# Patient Record
Sex: Female | Born: 2000 | Race: Black or African American | Hispanic: No | Marital: Single | Smoking: Never smoker
Health system: Southern US, Community
[De-identification: ages and names within clinical notes are randomized; demographics above are authoritative.]

## PROBLEM LIST (undated history)

## (undated) ENCOUNTER — Inpatient Hospital Stay (HOSPITAL_COMMUNITY): Payer: Self-pay

## (undated) DIAGNOSIS — J45909 Unspecified asthma, uncomplicated: Secondary | ICD-10-CM

## (undated) DIAGNOSIS — T783XXA Angioneurotic edema, initial encounter: Secondary | ICD-10-CM

## (undated) DIAGNOSIS — Z889 Allergy status to unspecified drugs, medicaments and biological substances status: Secondary | ICD-10-CM

## (undated) DIAGNOSIS — R51 Headache: Secondary | ICD-10-CM

## (undated) DIAGNOSIS — L309 Dermatitis, unspecified: Secondary | ICD-10-CM

## (undated) DIAGNOSIS — T7840XA Allergy, unspecified, initial encounter: Secondary | ICD-10-CM

## (undated) HISTORY — DX: Headache: R51

## (undated) HISTORY — DX: Allergy, unspecified, initial encounter: T78.40XA

## (undated) HISTORY — DX: Angioneurotic edema, initial encounter: T78.3XXA

## (undated) HISTORY — PX: NO PAST SURGERIES: SHX2092

---
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
## 2019-01-12 ENCOUNTER — Telehealth: Payer: Self-pay | Admitting: *Deleted

## 2019-01-12 NOTE — Telephone Encounter (Signed)
Caller is requesting emergency contraception.

## 2019-01-12 NOTE — Telephone Encounter (Signed)
Spoke with patient briefly to see how she would like to obtain medication. She asks for medication to be placed up front for pickup instead of being sent to the pharm. Made provider aware. Bernell List, NP to prepare medication and placed up front for pick up.

## 2019-01-12 NOTE — Telephone Encounter (Signed)
Sending to adolescent clinic

## 2019-01-12 NOTE — Telephone Encounter (Signed)
1 tablet of levonorgestrel 1.5 mg provided to patient. VQM:[id number] exp 05/2021  NDC 50932-671-24

## 2019-01-16 ENCOUNTER — Telehealth: Payer: Self-pay | Admitting: *Deleted

## 2019-01-16 NOTE — Telephone Encounter (Signed)
Caller has a question regarding her menstrual cycle in relation to taking emergency contraception on Monday. Please call her.

## 2019-01-19 NOTE — Telephone Encounter (Signed)
Called to triage. She no longer has any concerns but is open to a urine pregnancy and STI screening. Appointment made for nurse visit. She did state she took EC within 5 days of unprotected sex.

## 2019-01-27 ENCOUNTER — Telehealth: Payer: Self-pay

## 2019-01-27 NOTE — Telephone Encounter (Signed)
Pre-screening for in-office visit  1. Who is bringing the patient to the visit?  PT BRINGING HERSELF  Informed only one adult can bring patient to the visit to limit possible exposure to COVID19. And if they have a face mask to wear it.   2. Has the person bringing the patient or the patient traveled outside of the state in the past 14 days?   3. Has the person bringing the patient or the patient had contact with anyone with suspected or confirmed COVID-19 in the last 14 days?   NO 4. Has the person bringing the patient or the patient had any of these symptoms in the last 14 days? NO Fever (temp 100.4 F or higher) Difficulty breathing Cough  If all answers are negative, advise patient to call our office prior to your appointment if you or the patient develop any of the symptoms listed above. PT ADVISED If any answers are yes, cancel in-office visit and schedule the patient for a same day telehealth visit with a provider to discuss the next steps.

## 2019-01-28 ENCOUNTER — Telehealth: Payer: Self-pay | Admitting: *Deleted

## 2019-01-28 ENCOUNTER — Other Ambulatory Visit: Payer: Self-pay

## 2019-01-28 ENCOUNTER — Encounter: Payer: Self-pay | Admitting: Family

## 2019-01-28 ENCOUNTER — Ambulatory Visit (INDEPENDENT_AMBULATORY_CARE_PROVIDER_SITE_OTHER): Payer: Medicaid Other | Admitting: Family

## 2019-01-28 DIAGNOSIS — J029 Acute pharyngitis, unspecified: Secondary | ICD-10-CM | POA: Diagnosis not present

## 2019-01-28 DIAGNOSIS — Z113 Encounter for screening for infections with a predominantly sexual mode of transmission: Secondary | ICD-10-CM | POA: Diagnosis not present

## 2019-01-28 DIAGNOSIS — Z3202 Encounter for pregnancy test, result negative: Secondary | ICD-10-CM | POA: Diagnosis not present

## 2019-01-28 LAB — POCT URINE PREGNANCY: Preg Test, Ur: NEGATIVE

## 2019-01-28 MED ORDER — POLYETHYLENE GLYCOL 3350 17 GM/SCOOP PO POWD: 17.0000 g | Freq: Every day | ORAL | 0 refills | Status: DC

## 2019-01-28 NOTE — Progress Notes (Signed)
History was provided by the patient.   Wendy Adams is a 18 y.o. female who is here for sore throat.   PCP confirmed? Yes   Wendy Jews, MD (Inactive)  HPI:  18yo female presenting with 2 days of sore throat and nasal congestion. Also a little more tired than usual. No fever, cough, sick contacts, diarrhea, vomiting. She notes she's had intermittent LLQ pain, last stooled last night and it was hard. She feels her allergies have been well controlled during this time. 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   ROS noted in HPI.   Patient Active Problem List   Diagnosis Date Noted  . Asthma, well controlled 04/07/2018  . Seasonal allergies 04/07/2018  . Menstrual cramps 04/07/2018  . Hyperthyroidism 02/26/2018  . Abnormal thyroid blood test 01/25/2018  . Goiter 01/25/2018  . Unintentional weight loss 01/25/2018  . Normal vaginal delivery 03/08/2017  . Perennial and seasonal allergic rhinoconjunctivitis 12/02/2015  . Moderate persistent asthma 12/07/2013  . Eczema 12/07/2013  . 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 albuterol (PROVENTIL HFA;VENTOLIN HFA) 108 (90 Base) MCG/ACT inhaler Inhale 2 puffs into the lungs every 4 (four) hours as needed for wheezing or shortness of breath. 1 Inhaler 2  . EPINEPHrine (EPIPEN 2-PAK) 0.3 mg/0.3 mL IJ SOAJ injection Inject 0.3 mg into the muscle once as needed (for severe allergic reaction).    . fluticasone (FLONASE) 50 MCG/ACT nasal spray Place 2 sprays into both nostrils daily. 16 g 0  . guaiFENesin (MUCINEX) 600 MG 12 hr tablet Take 1 tablet (600 mg total) by mouth 2 (two) times daily. 6 tablet 0  . mometasone (ELOCON) 0.1 % ointment Apply 1 application topically daily as needed (for eczema).     No current  facility-administered medications on file prior to visit.     Allergies  Allergen Reactions  . Food Anaphylaxis, Hives and Other (See Comments)    Pt is allergic to peanut butter and [place].   . Beef-Derived Products Hives  . Chicken Protein Hives  . Eggs Or Egg-Derived Products Hives  . Pork-Derived Metallurgist  . Shellfish Allergy Hives    Physical Exam:    General: appears well nourished, well developed, and in no acute distress  HEENT: normocephalic and atraumatic. Sclera clear. Erythema noted in posterior oropharynx. No exudates or petechiae. Uvula is midline.  Neck: Supple, no lymphadenopathy, no tenderness  Respiratory: Normal WOB, no retractions nasal flaring or grunting. Normal and equal air movement bilaterally, no wheezes or crackles.  CV: Normal rate, regular rhythm. No murmurs rubs clicks or gallops appreciated. Cap refill <3 seconds.  Abdominal: Soft, mild tenderness LLQ, nondistended. No hepatosplenomegaly appreciated.  Extremities: Warm and well perfused  Neuro: Grossly normal, pt is alert, moving all extremities  Skin: No rashes, bruising, jaundice, or mottling noted.   Blood pressure percentiles are not available for patients who are 18 years or older. No LMP recorded.   Assessment/Plan: Wendy Adams is an 18yo female presenting with 2 days of sore throat and rhinorrhea without fever. Differential includes viral URI vs allergies-- overall acute 2 days of symptoms seems more consistent with acute viral infection. No evidence of bacterial infection on exam or history, centor criteria not met. Does not meet criteria  for COVID testing at this time.   1. URI symptoms: viral URI vs allergies - Supportive care reviewed   2. Abdominal pain, likely constipation  - Trial miralax 1-2 caps dialy  - RTC if worsening or not improved    Wendy Silversmith MD PGY-3

## 2019-01-28 NOTE — Telephone Encounter (Signed)
Patient called with c/o cough and chest pains. Advised her to go to [facility] now. Patient voiced understanding.

## 2019-01-29 ENCOUNTER — Other Ambulatory Visit: Payer: Self-pay | Admitting: Family

## 2019-01-29 DIAGNOSIS — B349 Viral infection, unspecified: Secondary | ICD-10-CM

## 2019-01-29 LAB — C. TRACHOMATIS/N. GONORRHOEAE RNA
C. trachomatis RNA, TMA: DETECTED — AB
N. gonorrhoeae RNA, TMA: NOT DETECTED

## 2019-01-29 LAB — WET PREP BY MOLECULAR PROBE
Candida species: NOT DETECTED
MICRO NUMBER:: 470586
SPECIMEN QUALITY:: ADEQUATE
Trichomonas vaginosis: NOT DETECTED

## 2019-01-29 MED ORDER — AZITHROMYCIN 500 MG PO TABS: 1000.0000 mg | ORAL_TABLET | Freq: Once | ORAL | 0 refills | Status: AC

## 2019-01-29 NOTE — Telephone Encounter (Signed)
Called and spoke with patient. She is feeling much better this am and chest pains have subsided. Advised patient to be seen in ED if chest pain sx arise again and importance of prompt assessment. Pt voiced understanding.

## 2019-01-29 NOTE — Progress Notes (Signed)
Ordered My Chart COVID-19 MONITORING COMPANION for patient to self-monitor.

## 2019-01-30 ENCOUNTER — Other Ambulatory Visit: Payer: Self-pay | Admitting: Family

## 2019-01-30 MED ORDER — AZITHROMYCIN 500 MG PO TABS: 1000.0000 mg | ORAL_TABLET | Freq: Once | ORAL | 0 refills | Status: AC

## 2019-02-04 ENCOUNTER — Telehealth: Payer: Self-pay | Admitting: Licensed Clinical Social Worker

## 2019-02-04 NOTE — Telephone Encounter (Signed)
Pre-screening for in-office visit  1. Who is bringing the patient to the visit? Patient bringing herself.  Informed only one adult can bring patient to the visit to limit possible exposure to COVID19. And if they have a face mask to wear it.   2. Has the person bringing the patient or the patient traveled outside of the state in the past 14 days? no   3. Has the person bringing the patient or the patient had contact with anyone with suspected or confirmed COVID-19 in the last 14 days? no   4. Has the person bringing the patient or the patient had any of these symptoms in the last 14 days? no   Fever (temp 100.4 F or higher) Difficulty breathing Cough  If all answers are negative, advise patient to call our office prior to your appointment if you or the patient develop any of the symptoms listed above.  

## 2019-02-05 ENCOUNTER — Other Ambulatory Visit: Payer: Self-pay

## 2019-02-05 ENCOUNTER — Ambulatory Visit (INDEPENDENT_AMBULATORY_CARE_PROVIDER_SITE_OTHER): Payer: Medicaid Other | Admitting: Family

## 2019-02-05 ENCOUNTER — Encounter: Payer: Self-pay | Admitting: Family

## 2019-02-05 VITALS — BP 119/71 | HR 83 | Ht 63.47 in | Wt 102.0 lb

## 2019-02-05 DIAGNOSIS — N946 Dysmenorrhea, unspecified: Secondary | ICD-10-CM | POA: Diagnosis not present

## 2019-02-05 DIAGNOSIS — Z3009 Encounter for other general counseling and advice on contraception: Secondary | ICD-10-CM

## 2019-02-05 DIAGNOSIS — Z789 Other specified health status: Secondary | ICD-10-CM

## 2019-02-05 MED ORDER — ULIPRISTAL ACETATE 30 MG PO TABS
30.0000 mg | ORAL_TABLET | Freq: Once | ORAL | Status: AC
  Administered 2019-02-05: 30 mg via ORAL

## 2019-02-05 NOTE — Progress Notes (Signed)
History was provided by the patient.  Wendy RansomKhianna L Adams is a 18 y.o. female who is here for emergency contraception and information on birth control options.   PCP confirmed? Yes.    Gwenith DailyGrier, Cherece Nicole, MD (Inactive)  HPI:   -unprotected sex yesterday, none before that since her last EC dose -no concerns for infection, no new partner -specifically, no pain with intercourse, no unexplained bleeding, no lesions, no vaginal discharge changes -she has never had birth control before; she says her mom wants her to get information and then she will come back and decide which one  -she has no migraine with aura, no known liver disease or cancers, no hx of DVT/PE  -of note, she has returned to work and has exhibited no concerning symptoms for COVID-19.   Review of Systems  Constitutional: Negative for fever, malaise/fatigue and weight loss.  HENT: Negative for sore throat.   Respiratory: Negative for cough and shortness of breath.   Cardiovascular: Negative for chest pain.  Gastrointestinal: Negative for abdominal pain and nausea.  Genitourinary: Negative for dysuria and frequency.  Skin: Negative for rash.  Neurological: Negative for headaches.     Patient Active Problem List   Diagnosis Date Noted  . Asthma, well controlled 04/07/2018  . Seasonal allergies 04/07/2018  . Menstrual cramps 04/07/2018  . Hyperthyroidism 02/26/2018  . Abnormal thyroid blood test 01/25/2018  . Goiter 01/25/2018  . Unintentional weight loss 01/25/2018  . Normal vaginal delivery 03/08/2017  . Perennial and seasonal allergic rhinoconjunctivitis 12/02/2015  . Moderate persistent asthma 12/07/2013  . Eczema 12/07/2013  . 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 albuterol (PROVENTIL HFA;VENTOLIN HFA) 108 (90 Base) MCG/ACT inhaler Inhale 2 puffs into the lungs every 4 (four) hours as needed for wheezing or  shortness of breath. 1 Inhaler 2  . EPINEPHrine (EPIPEN 2-PAK) 0.3 mg/0.3 mL IJ SOAJ injection Inject 0.3 mg into the muscle once as needed (for severe allergic reaction).    . fluticasone (FLONASE) 50 MCG/ACT nasal spray Place 2 sprays into both nostrils daily. 16 g 0  . guaiFENesin (MUCINEX) 600 MG 12 hr tablet Take 1 tablet (600 mg total) by mouth 2 (two) times daily. 6 tablet 0  . mometasone (ELOCON) 0.1 % ointment Apply 1 application topically daily as needed (for eczema).    . polyethylene glycol powder (GLYCOLAX/MIRALAX) 17 GM/SCOOP powder Take 17 g by mouth daily. Take 1 cap or 17 g by mouth daily. Mix with 8 oz of water. 255 g 0   No current facility-administered medications on file prior to visit.     Allergies  Allergen Reactions  . Food Anaphylaxis, Hives and Other (See Comments)    Pt is allergic to peanut butter and [place].   . Beef-Derived Products Hives  . Chicken Protein Hives  . Eggs Or Egg-Derived Products Hives  . Pork-Derived Conservation officer, natureroducts Hives  . Shellfish Allergy Hives    Physical Exam:    Vitals:   02/05/19 0857  BP: 119/71  Pulse: 83  Weight: 102 lb (46.3 kg)  Height: 5' 3.47" (1.612 m)    Blood pressure percentiles are not available for patients who are 18 years or older. No LMP recorded.  Physical Exam Vitals signs reviewed.  Constitutional:      Appearance: She is not ill-appearing.  Eyes:     Extraocular Movements: Extraocular movements intact.     Pupils:  Pupils are equal, round, and reactive to light.  Neck:     Musculoskeletal: Normal range of motion.  Cardiovascular:     Rate and Rhythm: Normal rate.  Pulmonary:     Effort: Pulmonary effort is normal.  Lymphadenopathy:     Cervical: No cervical adenopathy.  Skin:    General: Skin is warm and dry.     Findings: No rash.  Neurological:     General: No focal deficit present.     Mental Status: She is alert and oriented to person, place, and time.      Assessment/Plan: 1. Menstrual  cramps -Discussed that most methods will improve cramping and bleeding, with the exception of paragard  2. Uses emergency contraception -ulipristal acetate 30 mg tablet given today  -condom use reviewed  3. Birth control counseling -reviewed Tier 1 and Tier 2 birth control options, including IUD, Implant, Depo, Pill, Patch, Ring.  -discussed bleeding profiles for each, including procedures for LARC insertions, removals -discussed confidentiality and consent re: birth control options -gave her bedsider material she can discuss with mom  -advised her that she can return via RN visit for Depo, or My Chart message to schedule an insertion, or request for Rx.

## 2019-02-08 NOTE — Progress Notes (Signed)
Attending Co-Signature.  I am the supervising provider and available for consultation as needed for the nurse practitioner who assisted the resident with the assessment and management plan as documented.     Martha F Perry, MD [facility] Specialist   

## 2019-03-27 ENCOUNTER — Other Ambulatory Visit: Payer: Self-pay | Admitting: Family

## 2019-03-27 ENCOUNTER — Encounter: Payer: Self-pay | Admitting: Family

## 2019-03-27 ENCOUNTER — Ambulatory Visit (INDEPENDENT_AMBULATORY_CARE_PROVIDER_SITE_OTHER): Payer: Medicaid Other | Admitting: Family

## 2019-03-27 DIAGNOSIS — Z789 Other specified health status: Secondary | ICD-10-CM

## 2019-03-27 MED ORDER — ELLA 30 MG PO TABS: 1.0000 | ORAL_TABLET | Freq: Once | ORAL | 0 refills | Status: AC

## 2019-03-27 NOTE — Progress Notes (Signed)
Received phone call from patient; she needs EC and wants to start birth control today. The phone call was terminated prior to receiving any other information from the patient. I sent a prescription of EC to her pharmacy. She called back into office line but I could not hear anyone when I picked up.

## 2019-03-30 MED ORDER — ULIPRISTAL ACETATE 30 MG PO TABS: 1.0000 | ORAL_TABLET | Freq: Once | ORAL | 0 refills | Status: AC

## 2019-04-02 ENCOUNTER — Other Ambulatory Visit: Payer: Self-pay

## 2019-04-02 ENCOUNTER — Ambulatory Visit (INDEPENDENT_AMBULATORY_CARE_PROVIDER_SITE_OTHER): Payer: Medicaid Other | Admitting: Family

## 2019-04-02 VITALS — BP 107/71 | HR 80 | Ht 62.4 in | Wt 98.4 lb

## 2019-04-02 DIAGNOSIS — Z113 Encounter for screening for infections with a predominantly sexual mode of transmission: Secondary | ICD-10-CM

## 2019-04-02 DIAGNOSIS — Z7251 High risk heterosexual behavior: Secondary | ICD-10-CM

## 2019-04-02 MED ORDER — ULIPRISTAL ACETATE 30 MG PO TABS
30.0000 mg | ORAL_TABLET | Freq: Once | ORAL | Status: AC
  Administered 2019-04-02: 30 mg via ORAL

## 2019-04-02 NOTE — Progress Notes (Signed)
Pt came into clinic for plan b. Last period was on 03/18/19. Unprotected sex occurred on 7/10. Consulted Christy Jones,NP who recommended [place]. Patient to come back in 5 days for urine preg and Depo administration.

## 2019-04-03 ENCOUNTER — Encounter: Payer: Self-pay | Admitting: Family

## 2019-04-03 LAB — C. TRACHOMATIS/N. GONORRHOEAE RNA
C. trachomatis RNA, TMA: NOT DETECTED
N. gonorrhoeae RNA, TMA: NOT DETECTED

## 2019-04-07 ENCOUNTER — Ambulatory Visit: Payer: Medicaid Other

## 2019-04-14 ENCOUNTER — Ambulatory Visit: Payer: Medicaid Other

## 2019-04-24 ENCOUNTER — Other Ambulatory Visit: Payer: Self-pay

## 2019-04-24 ENCOUNTER — Ambulatory Visit (INDEPENDENT_AMBULATORY_CARE_PROVIDER_SITE_OTHER): Payer: Medicaid Other

## 2019-04-24 DIAGNOSIS — Z3042 Encounter for surveillance of injectable contraceptive: Secondary | ICD-10-CM

## 2019-04-24 DIAGNOSIS — Z3049 Encounter for surveillance of other contraceptives: Secondary | ICD-10-CM | POA: Diagnosis not present

## 2019-04-24 MED ORDER — MEDROXYPROGESTERONE ACETATE 150 MG/ML IM SUSP
150.0000 mg | Freq: Once | INTRAMUSCULAR | Status: AC
  Administered 2019-04-24: 150 mg via INTRAMUSCULAR

## 2019-04-24 NOTE — Progress Notes (Signed)
Patient arrived in clinic for Depo injection. Patient obtained urine preg which was neg. Patient scheduled for next injection in Oct.

## 2019-05-29 ENCOUNTER — Telehealth: Payer: Self-pay

## 2019-05-29 NOTE — Telephone Encounter (Signed)
Patient returned call at [phone]. She reports heavy bleeding, blood clots, cramps. Also has HA, dizziness.  Changing a large pad at least every 30 minutes sometimes more often. Discussed with Shela Commons NP and advised patient go to Urgent Care for evaluation as soon as possible. Also encouraged fluids to stay hydrated and help with dizziness/HA.

## 2019-05-29 NOTE — Telephone Encounter (Signed)
Patient is on depo and is concerned about spotting. Route to red pod RN.

## 2019-05-29 NOTE — Telephone Encounter (Signed)
Called number on file, no answer, no VM option avail. 

## 2019-06-01 ENCOUNTER — Other Ambulatory Visit (INDEPENDENT_AMBULATORY_CARE_PROVIDER_SITE_OTHER): Payer: Medicaid Other

## 2019-06-01 ENCOUNTER — Other Ambulatory Visit: Payer: Self-pay

## 2019-06-01 VITALS — BP 110/71 | HR 105

## 2019-06-01 DIAGNOSIS — Z3202 Encounter for pregnancy test, result negative: Secondary | ICD-10-CM | POA: Diagnosis not present

## 2019-06-01 DIAGNOSIS — Z113 Encounter for screening for infections with a predominantly sexual mode of transmission: Secondary | ICD-10-CM | POA: Diagnosis not present

## 2019-06-01 LAB — POCT URINE PREGNANCY: Preg Test, Ur: NEGATIVE

## 2019-06-01 NOTE — Telephone Encounter (Signed)
Spoke with patient who states bleeding has decreased since Friday. Suggested patient come into the clinic to r/o infection and have serum hcg. Made same day lab visit. She had provider visit for 9/16 to discuss bleeding and headaches with provider.

## 2019-06-02 ENCOUNTER — Other Ambulatory Visit: Payer: Self-pay | Admitting: Family

## 2019-06-02 LAB — HCG, QUANTITATIVE, PREGNANCY: HCG, Total, QN: <3 m[IU]/mL

## 2019-06-02 LAB — WET PREP BY MOLECULAR PROBE
Candida species: NOT DETECTED
Gardnerella vaginalis: NOT DETECTED
MICRO NUMBER:: 877695
SPECIMEN QUALITY:: ADEQUATE
Trichomonas vaginosis: DETECTED — AB

## 2019-06-02 LAB — C. TRACHOMATIS/N. GONORRHOEAE RNA
C. trachomatis RNA, TMA: DETECTED — AB
N. gonorrhoeae RNA, TMA: NOT DETECTED

## 2019-06-02 MED ORDER — METRONIDAZOLE 250 MG PO TABS: 2000.0000 mg | ORAL_TABLET | Freq: Once | ORAL | Status: DC

## 2019-06-02 NOTE — Patient Instructions (Addendum)
Chlamydia, Female  Chlamydia is an STD (sexually transmitted disease). It is a bacterial infection that spreads (is contagious) through sexual contact. Chlamydia can occur in different areas of the body, including:  The tube that moves urine from the bladder out of the body (urethra).  The lower part of the uterus (cervix).  The throat.  The rectum. This condition is not difficult to treat. However, if left untreated, chlamydia can lead to more serious health problems, including pelvic inflammatory disorder (PID). PID can increase your risk of not being able to have children (sterility). Also, if chlamydia is left untreated and you are pregnant or become pregnant, there is a chance that your baby can become infected during delivery. This may cause serious health problems for the baby. What are the causes? Chlamydia is caused by the bacteria Chlamydia trachomatis. It is passed from an infected partner during sexual activity. Chlamydia can spread through contact with the genitals, mouth, or rectum. What are the signs or symptoms? In some cases, there may not be any symptoms for this condition (asymptomatic), especially early in the infection. If symptoms develop, they may include:  Burning with urination.  Frequent urination.  Vaginal discharge.  Redness, soreness, and swelling (inflammation) of the rectum.  Bleeding or discharge from the rectum.  Abdominal pain.  Pain during sexual intercourse.  Bleeding between menstrual periods.  Itching, burning, or redness in the eyes, or discharge from the eyes. How is this diagnosed? This condition may be diagnosed with:  Urine tests.  Swab tests. Depending on your symptoms, your health care provider may use a cotton swab to collect discharge from your vagina or rectum to test for the bacteria.  A pelvic exam. How is this treated? This condition is treated with antibiotic medicines. If you are pregnant, certain types of antibiotics  will need to be avoided. Follow these instructions at home: Medicines  Take over-the-counter and prescription medicines only as told by your health care provider.  Take your antibiotic medicine as told by your health care provider. Do not stop taking the antibiotic even if you start to feel better. Sexual activity  Tell sexual partners about your infection. This includes any oral, anal, or vaginal sex partners you have had within 60 days of when your symptoms started. Sexual partners should also be treated, even if they have no signs of the disease.  Do not have sex until you and your sexual partners have completed treatment and your health care provider says it is okay. If your health care provider prescribed you a single dose treatment, wait 7 days after taking the treatment before having sex. General instructions  It is your responsibility to get your test results. Ask your health care provider, or the department performing the test, when your results will be ready.  Get plenty of rest.  Eat a healthy, well-balanced diet.  Drink enough fluids to keep your urine clear or pale yellow.  Keep all follow-up visits as told by your health care provider. This is important. You may need to be tested for infection again 3 months after treatment. How is this prevented? The only sure way to prevent chlamydia is to avoid having sex. However, you can lower your risk by:  Using latex condoms correctly every time you have sex.  Not having multiple sexual partners.  Asking if your sexual partner has been tested for STIs and had negative results. Contact a health care provider if:  You develop new symptoms or your symptoms do not  get better after completing treatment.  You have a fever or chills.  You have pain during sexual intercourse. Get help right away if:  Your pain gets worse and does not get better with medicine.  You develop flu-like symptoms, such as night sweats, sore throat, or  muscle aches.  You experience nausea or vomiting.  You have difficulty swallowing.  You have bleeding between periods or after sex.  You have irregular menstrual periods.  You have abdominal or lower back pain that does not get better with medicine.  You feel weak or dizzy, or you faint.  You are pregnant and you develop symptoms of chlamydia. Summary  Chlamydia is an STD (sexually transmitted disease). It is a bacterial infection that spreads (is contagious) through sexual contact.  This condition is not difficult to treat, however. If left untreated, chlamydia can lead to more serious health problems, including pelvic inflammatory disease (PID).  In some cases, there may not be any symptoms for this condition (asymptomatic).  This condition is treated with antibiotic medicines.  Using latex condoms correctly every time you have sex can help prevent chlamydia. This information is not intended to replace advice given to you by your health care provider. Make sure you discuss any questions you have with your health care provider. Document Released: 06/13/2005 Document Revised: 02/25/2018 Document Reviewed: 08/20/2016 Elsevier Patient Education  2020 [employer].  Trichomoniasis Trichomoniasis is an STI (sexually transmitted infection) that can affect both women and men. In women, the outer area of the female genitalia (vulva) and the vagina are affected. In men, mainly the penis is affected, but the prostate and other reproductive organs can also be involved.  This condition can be treated with medicine. It often has no symptoms (is asymptomatic), especially in men. If not treated, trichomoniasis can last for months or years. What are the causes? This condition is caused by a parasite called Trichomonas vaginalis. Trichomoniasis most often spreads from person to person (is contagious) through sexual contact. What increases the risk? The following factors may make you more likely  to develop this condition:  Having unprotected sex.  Having sex with a partner who has trichomoniasis.  Having multiple sexual partners.  Having had previous trichomoniasis infections or other STIs. What are the signs or symptoms? In women, symptoms of trichomoniasis include:  Abnormal vaginal discharge that is clear, white, gray, or yellow-green and foamy and has an unusual "fishy" odor.  Itching and irritation of the vagina and vulva.  Burning or pain during urination or sex.  Redness and swelling of the genitals. In men, symptoms of trichomoniasis include:  Penile discharge that may be foamy or contain pus.  Pain in the penis. This may happen only when urinating.  Itching or irritation inside the penis.  Burning after urination or ejaculation. How is this diagnosed? In women, this condition may be found during a routine Pap test or physical exam. It may be found in men during a routine physical exam. Your health care provider may do tests to help diagnose this infection, such as:  Urine tests (men and women).  The following in women: ? Testing the pH of the vagina. ? A vaginal swab test that checks for the Trichomonas vaginalis parasite. ? Testing vaginal secretions. Your health care provider may test you for other STIs, including HIV (human immunodeficiency virus). How is this treated?  This condition is treated with medicine taken by mouth (orally), such as metronidazole or tinidazole, to fight the infection. Your sexual  partner(s) also need to be tested and treated.  If you are a woman and you plan to become pregnant or think you may be pregnant, tell your health care provider right away. Some medicines that are used to treat the infection should not be taken during pregnancy. Your health care provider may recommend over-the-counter medicines or creams to help relieve itching or irritation. You may be tested for infection again 3 months after treatment. Follow these  instructions at home:  Take and use over-the-counter and prescription medicines, including creams, only as told by your health care provider.  Take your antibiotic medicine as told by your health care provider. Do not stop taking the antibiotic even if you start to feel better.  Do not have sex until 7-10 days after you finish your medicine, or until your health care provider approves. Ask your health care provider when you may start to have sex again.  (Women) Do not douche or wear tampons while you have the infection.  Discuss your infection with your sexual partner(s). Make sure that your partner gets tested and treated, if necessary.  Keep all follow-up visits as told by your health care provider. This is important. How is this prevented?    Use condoms every time you have sex. Using condoms correctly and consistently can help protect against STIs.  Avoid having multiple sexual partners.  Talk with your sexual partner about any symptoms that either of you may have, as well as any history of STIs.  Get tested for STIs and STDs (sexually transmitted diseases) before you have sex. Ask your partner to do the same.  Do not have sexual contact if you have symptoms of trichomoniasis or another STI. Contact a health care provider if:  You still have symptoms after you finish your medicine.  You develop pain in your abdomen.  You have pain when you urinate.  You have bleeding after sex.  You develop a rash.  You feel nauseous or you vomit.  You plan to become pregnant or think you may be pregnant. Summary  Trichomoniasis is an STI (sexually transmitted infection) that can affect both women and men.  This condition often has no symptoms (is asymptomatic), especially in men.  Without treatment, this condition can last for months or years.  You should not have sex until 7-10 days after you finish your medicine, or until your health care provider approves. Ask your health  care provider when you may start to have sex again.  Discuss your infection with your sexual partner(s). Make sure that your partner gets tested and treated, if necessary. This information is not intended to replace advice given to you by your health care provider. Make sure you discuss any questions you have with your health care provider.

## 2019-06-02 NOTE — Progress Notes (Unsigned)
Pt here today for STI treatment. Allergies reviewed. Medication tolerated well. Pt education given- along with importance to abstain from sex for 7 days to ensure infection has cured. Follow up appointment scheduled with provider.  

## 2019-06-03 ENCOUNTER — Ambulatory Visit: Payer: Medicaid Other | Admitting: Family

## 2019-06-04 ENCOUNTER — Other Ambulatory Visit: Payer: Medicaid Other

## 2019-06-09 ENCOUNTER — Other Ambulatory Visit: Payer: Medicaid Other

## 2019-06-10 ENCOUNTER — Other Ambulatory Visit: Payer: Medicaid Other

## 2019-06-12 ENCOUNTER — Other Ambulatory Visit: Payer: Self-pay

## 2019-06-12 ENCOUNTER — Other Ambulatory Visit: Payer: Medicaid Other

## 2019-06-12 ENCOUNTER — Ambulatory Visit (INDEPENDENT_AMBULATORY_CARE_PROVIDER_SITE_OTHER): Payer: Medicaid Other | Admitting: Pediatrics

## 2019-06-12 DIAGNOSIS — A749 Chlamydial infection, unspecified: Secondary | ICD-10-CM

## 2019-06-12 DIAGNOSIS — Z202 Contact with and (suspected) exposure to infections with a predominantly sexual mode of transmission: Secondary | ICD-10-CM

## 2019-06-12 DIAGNOSIS — A599 Trichomoniasis, unspecified: Secondary | ICD-10-CM

## 2019-06-12 MED ORDER — AZITHROMYCIN 500 MG PO TABS
1000.0000 mg | ORAL_TABLET | Freq: Once | ORAL | Status: AC
  Administered 2019-06-12: 1000 mg via ORAL

## 2019-06-12 MED ORDER — METRONIDAZOLE 250 MG PO TABS
2000.0000 mg | ORAL_TABLET | Freq: Once | ORAL | Status: AC
  Administered 2019-06-12: 2000 mg via ORAL

## 2019-06-12 MED ORDER — AZITHROMYCIN 250 MG PO TABS: 1000.0000 mg | ORAL_TABLET | Freq: Every day | ORAL | Status: DC

## 2019-06-12 MED ORDER — CEFTRIAXONE SODIUM 250 MG IJ SOLR
250.0000 mg | Freq: Once | INTRAMUSCULAR | Status: AC
  Administered 2019-06-12: 250 mg via INTRAMUSCULAR

## 2019-06-12 NOTE — Progress Notes (Signed)
History was provided by the patient.  Wendy Adams is a 18 y.o. female who is here for treatment of her trichomonas vaginosis, and c. Trachomatis.      HPI:    - 43 female partner reported.  - Was tested on 9/14 for trichomonas, candida, gardenerella, c. Trach, gonnorhea. She was found to be positive for trichomonas and c.trach.  - She has missed multiple appointments for followup treatment in the past 2 weeks.  - She doesn't know if her partner has had sex with other people. - She doesn't use condoms regularly. - She has no fever, no abdo pain, no vomiting or nausea, no discharge, no urinary symptoms,  - She has returned today to receive treatment.  Patient Active Problem List   Diagnosis Date Noted  . Asthma, well controlled 04/07/2018  . Seasonal allergies 04/07/2018  . Menstrual cramps 04/07/2018  . Hyperthyroidism 02/26/2018  . Abnormal thyroid blood test 01/25/2018  . Goiter 01/25/2018  . Unintentional weight loss 01/25/2018  . Normal vaginal delivery 03/08/2017  . Perennial and seasonal allergic rhinoconjunctivitis 12/02/2015  . Moderate persistent asthma 12/07/2013  . Eczema 12/07/2013  . 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 albuterol (PROVENTIL HFA;VENTOLIN HFA) 108 (90 Base) MCG/ACT inhaler Inhale 2 puffs into the lungs every 4 (four) hours as needed for wheezing or shortness of breath. 1 Inhaler 2  . EPINEPHrine (EPIPEN 2-PAK) 0.3 mg/0.3 mL IJ SOAJ injection Inject 0.3 mg into the muscle once as needed (for severe allergic reaction).    . fluticasone (FLONASE) 50 MCG/ACT nasal spray Place 2 sprays into both nostrils daily. 16 g 0  . guaiFENesin (MUCINEX) 600 MG 12 hr tablet Take 1 tablet (600 mg total) by mouth 2 (two) times daily. 6 tablet 0  . mometasone (ELOCON) 0.1 % ointment Apply 1 application topically daily as needed (for eczema).    . polyethylene glycol  powder (GLYCOLAX/MIRALAX) 17 GM/SCOOP powder Take 17 g by mouth daily. Take 1 cap or 17 g by mouth daily. Mix with 8 oz of water. 255 g 0   Current Facility-Administered Medications on File Prior to Visit  Medication Dose Route Frequency Provider Last Rate Last Dose  . metroNIDAZOLE (FLAGYL) tablet 2,000 mg  2,000 mg Oral Once Parthenia Ames, NP        The following portions of the patient's history were reviewed and updated as appropriate: She  has a past medical history of Allergy, Asthma, Eczema, Headache(784.0), and Multiple allergies. She does not have any pertinent problems on file. She  has a past surgical history that includes No past surgeries. Her family history includes Asthma in her father; Diabetes in her maternal grandfather and maternal grandmother; Eczema in her father; Lupus in her maternal grandmother; Migraines in her mother. She  reports that she has never smoked. She has never used smokeless tobacco. She reports that she does not drink alcohol or use drugs. She is allergic to food; beef-derived products; chicken protein; eggs or egg-derived products; pork-derived products; and shellfish allergy..  Physical Exam:   There were no vitals filed for this visit. Blood pressure percentiles are not available for patients who are 18 years or older. No LMP recorded.    General:   alert, cooperative and no distress  Gait:   normal  Skin:   normal  Oral cavity:   lips, mucosa, and tongue normal; teeth and  gums normal  Eyes:   sclerae white, pupils equal and reactive     Neck:   no adenopathy  Lungs:  clear to auscultation bilaterally  Heart:   regular rate and rhythm, S1, S2 normal, no murmur, click, rub or gallop  Abdomen:  soft, non-tender; bowel sounds normal; no masses,  no organomegaly  GU:  not examined  Extremities:   extremities normal, atraumatic, no cyanosis or edema  Neuro:  normal without focal findings      Assessment/Plan: [name] is a 18 y.o female with  positive testing for trichomonas and C.Trachomatis. She required treatment today with 2g of metronidazole, 1000mg  Azithromycin, and 250mg  IM ceftriaxone. She was given guidance on safe sexual practices and contact tracing. She was provided condoms and written information on use. She was also provided 1000mg  Azithromycin for her partner. She will need to return in 2 weeks for re-testing for C. Trachomatis.  - Immunizations today: none  - Follow-up visit in 2 weeks for re-testing, or sooner as needed.

## 2019-06-12 NOTE — Patient Instructions (Addendum)
Wendy Adams you were treated today for trichomonas and chlamydia. Please come back in 2 weeks for re-testing to make sure the infection has cleared. Ongoing infection can result in significant harm to your reproductive abilities in the future, as well as spread of the infection to others. You have been provided 2 red pills for your partner this will treat the chlamydia infection, however, he must be tested and treated as well.  Thank you for coming today  Welford Roche, MD [facility] PGY1 Peds Teaching Service

## 2019-06-24 ENCOUNTER — Other Ambulatory Visit: Payer: Self-pay

## 2019-06-24 ENCOUNTER — Ambulatory Visit (INDEPENDENT_AMBULATORY_CARE_PROVIDER_SITE_OTHER): Payer: Medicaid Other

## 2019-06-24 DIAGNOSIS — Z113 Encounter for screening for infections with a predominantly sexual mode of transmission: Secondary | ICD-10-CM | POA: Diagnosis not present

## 2019-06-24 NOTE — Progress Notes (Signed)
Here for repeat urine specimen. RTC 07/09/19 for Depo.

## 2019-06-29 LAB — C. TRACHOMATIS/N. GONORRHOEAE RNA

## 2019-06-29 LAB — EXTRA URINE SPECIMEN

## 2019-07-09 ENCOUNTER — Other Ambulatory Visit (HOSPITAL_COMMUNITY)
Admission: RE | Admit: 2019-07-09 | Discharge: 2019-07-09 | Disposition: A | Discharge status: Home / Self Care | Payer: Medicaid Other | Source: Ambulatory Visit | Attending: Pediatrics | Admitting: Pediatrics

## 2019-07-09 ENCOUNTER — Other Ambulatory Visit: Payer: Self-pay

## 2019-07-09 ENCOUNTER — Ambulatory Visit (INDEPENDENT_AMBULATORY_CARE_PROVIDER_SITE_OTHER): Payer: Medicaid Other

## 2019-07-09 ENCOUNTER — Ambulatory Visit: Payer: Self-pay

## 2019-07-09 DIAGNOSIS — Z113 Encounter for screening for infections with a predominantly sexual mode of transmission: Secondary | ICD-10-CM | POA: Insufficient documentation

## 2019-07-09 DIAGNOSIS — Z3049 Encounter for surveillance of other contraceptives: Secondary | ICD-10-CM

## 2019-07-09 DIAGNOSIS — Z3042 Encounter for surveillance of injectable contraceptive: Secondary | ICD-10-CM

## 2019-07-09 MED ORDER — MEDROXYPROGESTERONE ACETATE 150 MG/ML IM SUSP
150.0000 mg | Freq: Once | INTRAMUSCULAR | Status: AC
  Administered 2019-07-09: 150 mg via INTRAMUSCULAR

## 2019-07-09 NOTE — Addendum Note (Signed)
Addended by: Jason Fila on: 07/09/2019 08:53 AM   Modules accepted: Orders

## 2019-07-09 NOTE — Progress Notes (Signed)
Pt presents for depo injection. Pt within depo window, no urine hcg needed. Injection given, tolerated well. F/u depo injection visit scheduled. Also retesting for test of cure. Urine specimen sent to lab. [phone] confidential number.

## 2019-07-10 LAB — URINE CYTOLOGY ANCILLARY ONLY
Chlamydia: NEGATIVE
Comment: NEGATIVE
Comment: NEGATIVE
Comment: NORMAL
Neisseria Gonorrhea: NEGATIVE
Trichomonas: NEGATIVE

## 2019-07-21 ENCOUNTER — Ambulatory Visit: Payer: Medicaid Other | Admitting: Family

## 2019-07-21 ENCOUNTER — Encounter: Payer: Self-pay | Admitting: Family

## 2019-07-21 ENCOUNTER — Other Ambulatory Visit: Payer: Self-pay

## 2019-07-27 ENCOUNTER — Ambulatory Visit: Payer: Medicaid Other | Admitting: Pediatrics

## 2019-08-16 ENCOUNTER — Encounter: Payer: Self-pay | Admitting: Family

## 2019-08-17 ENCOUNTER — Other Ambulatory Visit (INDEPENDENT_AMBULATORY_CARE_PROVIDER_SITE_OTHER): Payer: Medicaid Other

## 2019-08-17 ENCOUNTER — Other Ambulatory Visit: Payer: Self-pay

## 2019-08-17 ENCOUNTER — Encounter: Payer: Self-pay | Admitting: Family

## 2019-08-17 ENCOUNTER — Other Ambulatory Visit (HOSPITAL_COMMUNITY)
Admission: RE | Admit: 2019-08-17 | Discharge: 2019-08-17 | Disposition: A | Discharge status: Home / Self Care | Payer: Medicaid Other | Source: Ambulatory Visit | Attending: Pediatrics | Admitting: Pediatrics

## 2019-08-17 DIAGNOSIS — Z113 Encounter for screening for infections with a predominantly sexual mode of transmission: Secondary | ICD-10-CM | POA: Insufficient documentation

## 2019-08-17 DIAGNOSIS — R300 Dysuria: Secondary | ICD-10-CM | POA: Diagnosis not present

## 2019-08-17 DIAGNOSIS — Z1389 Encounter for screening for other disorder: Secondary | ICD-10-CM

## 2019-08-17 LAB — POCT URINALYSIS DIPSTICK
Bilirubin, UA: NEGATIVE
Blood, UA: POSITIVE
Glucose, UA: NEGATIVE
Ketones, UA: NEGATIVE
Nitrite, UA: NEGATIVE
Protein, UA: POSITIVE — AB
Spec Grav, UA: >=1.03 — AB (ref 1.010–1.025)
Urobilinogen, UA: NEGATIVE E.U./dL — AB
pH, UA: 5 (ref 5.0–8.0)

## 2019-08-17 NOTE — Progress Notes (Signed)
Pt came into clinic for STI screenings due to vaginal irritation. Specimens obtained by Rejeana Brock, CMA and sent to lab per protocol.

## 2019-08-18 ENCOUNTER — Other Ambulatory Visit: Payer: Self-pay | Admitting: Pediatrics

## 2019-08-18 LAB — URINE CYTOLOGY ANCILLARY ONLY
Chlamydia: NEGATIVE
Comment: NEGATIVE
Comment: NEGATIVE
Comment: NORMAL
Neisseria Gonorrhea: NEGATIVE
Trichomonas: NEGATIVE

## 2019-08-18 LAB — WET PREP BY MOLECULAR PROBE
Candida species: NOT DETECTED
Gardnerella vaginalis: NOT DETECTED
MICRO NUMBER:: 1145924
SPECIMEN QUALITY:: ADEQUATE
Trichomonas vaginosis: NOT DETECTED

## 2019-08-18 MED ORDER — MOMETASONE FUROATE 0.1 % EX OINT: 1.0000 "application " | TOPICAL_OINTMENT | Freq: Every day | CUTANEOUS | 3 refills | Status: DC

## 2019-08-19 LAB — URINE CULTURE
MICRO NUMBER:: 1145925
SPECIMEN QUALITY:: ADEQUATE

## 2019-08-21 ENCOUNTER — Telehealth: Payer: Self-pay | Admitting: Pediatrics

## 2019-08-21 ENCOUNTER — Other Ambulatory Visit: Payer: Self-pay | Admitting: Pediatrics

## 2019-08-21 DIAGNOSIS — J4540 Moderate persistent asthma, uncomplicated: Secondary | ICD-10-CM

## 2019-08-21 NOTE — Telephone Encounter (Signed)
[facility] and needs a referral sent to there office due to patient having an appointment on Monday for concerns of her allergies. Would you please place the referral?

## 2019-08-21 NOTE — Telephone Encounter (Signed)
Done

## 2019-08-24 ENCOUNTER — Ambulatory Visit (INDEPENDENT_AMBULATORY_CARE_PROVIDER_SITE_OTHER): Payer: Medicaid Other | Admitting: Allergy

## 2019-08-24 ENCOUNTER — Encounter: Payer: Self-pay | Admitting: Allergy

## 2019-08-24 ENCOUNTER — Other Ambulatory Visit: Payer: Self-pay

## 2019-08-24 VITALS — BP 116/84 | HR 100 | Temp 98.1°F | Resp 16 | Ht 63.1 in | Wt 106.2 lb

## 2019-08-24 DIAGNOSIS — T7800XD Anaphylactic reaction due to unspecified food, subsequent encounter: Secondary | ICD-10-CM

## 2019-08-24 DIAGNOSIS — J4520 Mild intermittent asthma, uncomplicated: Secondary | ICD-10-CM | POA: Diagnosis not present

## 2019-08-24 DIAGNOSIS — J302 Other seasonal allergic rhinitis: Secondary | ICD-10-CM | POA: Diagnosis not present

## 2019-08-24 DIAGNOSIS — L2089 Other atopic dermatitis: Secondary | ICD-10-CM

## 2019-08-24 DIAGNOSIS — J3089 Other allergic rhinitis: Secondary | ICD-10-CM

## 2019-08-24 MED ORDER — TRIAMCINOLONE ACETONIDE 0.5 % EX OINT: TOPICAL_OINTMENT | CUTANEOUS | 2 refills | Status: DC

## 2019-08-24 MED ORDER — ALBUTEROL SULFATE HFA 108 (90 BASE) MCG/ACT IN AERS: 2.0000 | INHALATION_SPRAY | RESPIRATORY_TRACT | 2 refills | Status: DC | PRN

## 2019-08-24 MED ORDER — EPINEPHRINE 0.3 MG/0.3ML IJ SOAJ: 0.3000 mg | INTRAMUSCULAR | 2 refills | Status: DC | PRN

## 2019-08-24 NOTE — Assessment & Plan Note (Addendum)
Currently avoiding shellfish, peanuts, tree nuts, peas and eggs due to reaction after ingestion in the form of whole body pruritus and rash.  No prior epinephrine use.  2017 skin testing was positive to shellfish however negative to peanuts, tree nut, eggs and peas.  Patient did not get recommended allergy blood work.  Continue to avoid shellfish, tree nuts, peanuts, peas and eggs.  Get blood work and will make additional recommendations based on results.  I have prescribed epinephrine injectable and demonstrated proper use. For mild symptoms you can take over the counter antihistamines such as Benadryl and monitor symptoms closely. If symptoms worsen or if you have severe symptoms including breathing issues, throat closure, significant swelling, whole body hives, severe diarrhea and vomiting, lightheadedness then inject epinephrine and seek immediate medical care afterwards.  Food action plan given.

## 2019-08-24 NOTE — Assessment & Plan Note (Signed)
Eczema is usually worse in the winter months and currently having a flare despite using hydrocortisone and mometasone 0.1% ointment.  No specific triggers noted.  Start proper skin care.   May use triamcinolone 0.5% ointment twice a day as daily to affected areas up to 3 weeks in a row; stop when clear and can restart as needed for flares. Do not use on the face, neck, armpits or groin area. Do not use more than 3 weeks in a row.   Start using a daily antihistamine to help with the itching - May use over the counter antihistamines such as Zyrtec (cetirizine), Claritin (loratadine), Allegra (fexofenadine), or Xyzal (levocetirizine) daily.

## 2019-08-24 NOTE — Progress Notes (Signed)
New Patient Note  RE: ANGELIC Adams MRN: [id number] DOB: 04-17-2001 Date of Office Visit: 08/24/2019  Referring provider: No ref. provider found Primary care provider: Georges Mouse, NP  Chief Complaint: Itchy Eyes (Started last month with swelling) and Eczema (Started with cold weather)  History of Present Illness: I had the pleasure of seeing Wendy Adams for initial evaluation at the [facility] on 08/24/2019. She is a 18 y.o. female, who is referred here by Georges Mouse, NP for the evaluation of eczema, asthma and allergy.  Patient was last seen in our office in December 14, 2015 by Dr. Nunzio Cobbs for allergic rhinoconjunctivitis, asthma, food allergy and eczema.  Eczema: Patient had eczema since birth. Mainly occurs on her arms and legs. Describes them as pruritic, hyperpigmented. Usually flares in the winter months. Suspected triggers are unknown. Denies any fevers, chills, changes in medications, foods, personal care products or recent infections. She has tried the following therapies: hydrocortisone and mometasone with no benefit. Systemic steroids no. Uses Vaseline to moisturize.   Food: She reports food allergy to shellfish, peanuts, tree nuts, peas, and ? eggs.  The reaction occurred as a child and was mainly in the form of whole body pruritus and rash. Denies any GI or respiratory symptoms associated with it.  Symptoms usually resolve by the next day. Only had to take benadryl for this in the past.   She does not have access to epinephrine autoinjector as it has expired and not needed to use it.   Past work up includes: 2017 skin testing was positive to shellfish mix; negative peanut, tree nuts, eggs, peas. She did not get bloodwork as recommended.  Dietary History: patient has been eating other foods including milk, sesame, seafood, soy, wheat, meats, fruits and vegetables. No prior sesame ingestion.   Asthma: She reports symptoms of chest tightness,  shortness of breath, coughing, wheezing, nocturnal awakenings for 18 years. Current medications include albuterol prn which help. She reports not using aerochamber with inhalers. She tried the following inhalers: Qvar. Main triggers are exercise. In the last month, frequency of symptoms: 0x/week. Frequency of nocturnal symptoms: 0x/month. Frequency of SABA use: 0x/week. Interference with physical activity: yes. Sleep is undisturbed. In the last 12 months, emergency room visits/urgent care visits/doctor office visits or hospitalizations due to respiratory issues: 0. In the last 12 months, oral steroids courses: 0. Lifetime history of hospitalization for respiratory issues: 0. Prior intubations: 0. She was evaluated by allergist in the past. Smoking exposure: no.   Allergic rhino conjunctivitis: Perennial rhino conjunctivitis and currently taking Flonase prn and OTC antihistamines prn with good benefit.   Assessment and Plan: [name] is a 18 y.o. female with: Other atopic dermatitis Eczema is usually worse in the winter months and currently having a flare despite using hydrocortisone and mometasone 0.1% ointment.  No specific triggers noted.  Start proper skin care.   May use triamcinolone 0.5% ointment twice a day as daily to affected areas up to 3 weeks in a row; stop when clear and can restart as needed for flares. Do not use on the face, neck, armpits or groin area. Do not use more than 3 weeks in a row.   Start using a daily antihistamine to help with the itching - May use over the counter antihistamines such as Zyrtec (cetirizine), Claritin (loratadine), Allegra (fexofenadine), or Xyzal (levocetirizine) daily.  Mild intermittent asthma without complication Asthma since a young child and currently uses albuterol as needed  for exercise-induced symptoms.  Last flare was over a year ago.  Today's spirometry was normal given today's efforts.  May use albuterol rescue inhaler 2 puffs or nebulizer  every 4 to 6 hours as needed for shortness of breath, chest tightness, coughing, and wheezing. May use albuterol rescue inhaler 2 puffs 5 to 15 minutes prior to strenuous physical activities. Monitor frequency of use.   Spacer given and demonstrated proper use with inhaler. Patient understood technique and all questions/concerned were addressed.   Allergy with anaphylaxis due to food, subsequent encounter Currently avoiding shellfish, peanuts, tree nuts, peas and eggs due to reaction after ingestion in the form of whole body pruritus and rash.  No prior epinephrine use.  2017 skin testing was positive to shellfish however negative to peanuts, tree nut, eggs and peas.  Patient did not get recommended allergy blood work.  Continue to avoid shellfish, tree nuts, peanuts, peas and eggs.  Get blood work and will make additional recommendations based on results.  I have prescribed epinephrine injectable and demonstrated proper use. For mild symptoms you can take over the counter antihistamines such as Benadryl and monitor symptoms closely. If symptoms worsen or if you have severe symptoms including breathing issues, throat closure, significant swelling, whole body hives, severe diarrhea and vomiting, lightheadedness then inject epinephrine and seek immediate medical care afterwards.  Food action plan given.   Perennial and seasonal allergic rhinoconjunctivitis Mild perennial rhinoconjunctivitis symptoms and using Flonase and over-the-counter antihistamines as needed with some benefit.  2017 skin testing was positive to dust mites, cockroach, tree, grass, ragweed, weed, mold and cat.  Continue environmental control measures.  May use over the counter antihistamines such as Zyrtec (cetirizine), Claritin (loratadine), Allegra (fexofenadine), or Xyzal (levocetirizine) daily as needed.  May use Flonase 1 to 2 sprays per nostril once a day as needed for nasal symptoms.  Return in about 2 months (around  10/25/2019).  Meds ordered this encounter  Medications  . triamcinolone ointment (KENALOG) 0.5 %    Sig: Apply twice a day to roughened skin area. Do not use on the face, neck, armpits or groin area. Do not use more than 3 weeks in a row.    Dispense:  30 g    Refill:  2  . albuterol (VENTOLIN HFA) 108 (90 Base) MCG/ACT inhaler    Sig: Inhale 2 puffs into the lungs every 4 (four) hours as needed for wheezing or shortness of breath (coughing).    Dispense:  18 g    Refill:  2  . EPINEPHrine 0.3 mg/0.3 mL IJ SOAJ injection    Sig: Inject 0.3 mLs (0.3 mg total) into the muscle as needed for anaphylaxis.    Dispense:  1 each    Refill:  2    Lab Orders     IgE Nut Prof. w/Component Rflx     Allergen Profile, Shellfish     Allergen Egg White     Allergen Pea f12  Other allergy screening: Medication allergy: no Hymenoptera allergy: no Urticaria: no History of recurrent infections suggestive of immunodeficency: no  Diagnostics: Spirometry:  Tracings reviewed. Her effort: It was hard to get consistent efforts and there is a question as to whether this reflects a maximal maneuver. FVC: 3.28L FEV1: 2.81L, 99% predicted FEV1/FVC ratio: 86% Interpretation: No overt abnormalities noted given today's efforts.  Please see scanned spirometry results for details.  Skin Testing: declined as patient had to go back to work.  Past Medical History: Patient Active Problem  List   Diagnosis Date Noted  . Mild intermittent asthma without complication 08/24/2019  . Seasonal and perennial allergic rhinitis 08/24/2019  . Asthma, well controlled 04/07/2018  . Seasonal allergies 04/07/2018  . Menstrual cramps 04/07/2018  . Hyperthyroidism 02/26/2018  . Abnormal thyroid blood test 01/25/2018  . Goiter 01/25/2018  . Unintentional weight loss 01/25/2018  . Normal vaginal delivery 03/08/2017  . Perennial and seasonal allergic rhinoconjunctivitis 12/02/2015  . Moderate persistent asthma 12/07/2013   . Other atopic dermatitis 12/07/2013  . Allergy with anaphylaxis due to food, subsequent encounter 12/07/2013   Past Medical History:  Diagnosis Date  . Allergy   . Angio-edema   . Asthma   . Eczema   . Headache(784.0)   . Multiple allergies    Past Surgical History: Past Surgical History:  Procedure Laterality Date  . NO PAST SURGERIES     Medication List:  Current Outpatient Medications  Medication Sig Dispense Refill  . albuterol (VENTOLIN HFA) 108 (90 Base) MCG/ACT inhaler Inhale 2 puffs into the lungs every 4 (four) hours as needed for wheezing or shortness of breath (coughing). 18 g 2  . EPINEPHrine 0.3 mg/0.3 mL IJ SOAJ injection Inject 0.3 mLs (0.3 mg total) into the muscle as needed for anaphylaxis. 1 each 2  . fluticasone (FLONASE) 50 MCG/ACT nasal spray Place 2 sprays into both nostrils daily. (Patient not taking: Reported on 08/24/2019) 16 g 0  . polyethylene glycol powder (GLYCOLAX/MIRALAX) 17 GM/SCOOP powder Take 17 g by mouth daily. Take 1 cap or 17 g by mouth daily. Mix with 8 oz of water. (Patient not taking: Reported on 08/24/2019) 255 g 0  . triamcinolone ointment (KENALOG) 0.5 % Apply twice a day to roughened skin area. Do not use on the face, neck, armpits or groin area. Do not use more than 3 weeks in a row. 30 g 2   No current facility-administered medications for this visit.    Allergies: Allergies  Allergen Reactions  . Food Anaphylaxis, Hives and Other (See Comments)    Pt is allergic to peanut butter and [place].   . Beef-Derived Products Hives  . Chicken Protein Hives  . Eggs Or Egg-Derived Products Hives  . Pork-Derived Conservation officer, nature  . Shellfish Allergy Hives   Social History: Social History   Socioeconomic History  . Marital status: Single    Spouse name: Not on file  . Number of children: Not on file  . Years of education: Not on file  . Highest education level: Not on file  Occupational History  . Not on file  Social Needs  .  Financial resource strain: Not on file  . Food insecurity    Worry: Not on file    Inability: Not on file  . Transportation needs    Medical: Not on file    Non-medical: Not on file  Tobacco Use  . Smoking status: Never Smoker  . Smokeless tobacco: Never Used  Substance and Sexual Activity  . Alcohol use: No  . Drug use: No  . Sexual activity: Yes    Birth control/protection: Condom  Lifestyle  . Physical activity    Days per week: Not on file    Minutes per session: Not on file  . Stress: Not on file  Relationships  . Social Musician on phone: Not on file    Gets together: Not on file    Attends religious service: Not on file    Active member of club or  organization: Not on file    Attends meetings of clubs or organizations: Not on file    Relationship status: Not on file  Other Topics Concern  . Not on file  Social History Narrative  . Not on file   Lives in a 19 year old home. Smoking: Denies Occupation: Works at [employer] History: Environmental education officer in the house: no Charity fundraiser in the family room: no Carpet in the bedroom: no Heating: gas Cooling: central Pet: no  Family History: Family History  Problem Relation Age of Onset  . Asthma Father   . Eczema Father   . Diabetes Maternal Grandmother   . Lupus Maternal Grandmother   . Diabetes Maternal Grandfather   . Migraines Mother   . Alcohol abuse Neg Hx   . Arthritis Neg Hx   . Birth defects Neg Hx   . Cancer Neg Hx   . COPD Neg Hx   . Depression Neg Hx   . Drug abuse Neg Hx   . Early death Neg Hx   . Hearing loss Neg Hx   . Heart disease Neg Hx   . Hyperlipidemia Neg Hx   . Hypertension Neg Hx   . Kidney disease Neg Hx   . Learning disabilities Neg Hx   . Mental illness Neg Hx   . Mental retardation Neg Hx   . Vision loss Neg Hx   . Allergic rhinitis Neg Hx   . Angioedema Neg Hx   . Atopy Neg Hx   . Immunodeficiency Neg Hx   . Urticaria Neg Hx   . Cystic fibrosis Neg  Hx   . Emphysema Neg Hx    Review of Systems  Constitutional: Negative for appetite change, chills, fever and unexpected weight change.  HENT: Negative for congestion and rhinorrhea.   Eyes: Negative for itching.  Respiratory: Negative for cough, chest tightness, shortness of breath and wheezing.   Cardiovascular: Negative for chest pain.  Gastrointestinal: Negative for abdominal pain.  Genitourinary: Negative for difficulty urinating.  Skin: Positive for rash.  Allergic/Immunologic: Positive for environmental allergies and food allergies.  Neurological: Negative for headaches.   Objective: BP 116/84 (BP Location: Right Arm, Patient Position: Sitting, Cuff Size: Normal)   Pulse 100   Temp 98.1 F (36.7 C) (Temporal)   Resp 16   Ht 5' 3.1" (1.603 m)   Wt 106 lb 3.2 oz (48.2 kg)   SpO2 98%   BMI 18.75 kg/m  Body mass index is 18.75 kg/m. Physical Exam  Constitutional: She is oriented to person, place, and time. She appears well-developed and well-nourished.  HENT:  Head: Normocephalic and atraumatic.  Right Ear: External ear normal.  Left Ear: External ear normal.  Nose: Nose normal.  Mouth/Throat: Oropharynx is clear and moist.  Eyes: Conjunctivae and EOM are normal.  Neck: Neck supple.  Cardiovascular: Normal rate, regular rhythm and normal heart sounds. Exam reveals no gallop and no friction rub.  No murmur heard. Pulmonary/Chest: Effort normal and breath sounds normal. She has no wheezes. She has no rales.  Abdominal: Soft.  Neurological: She is alert and oriented to person, place, and time.  Skin: Skin is warm and dry. Rash noted.  Dry patches of hyperpigmentation on the lower extremities b/l, elbow area and right wrist region.  Psychiatric: She has a normal mood and affect. Her behavior is normal.  Nursing note and vitals reviewed.  The plan was reviewed with the patient/family, and all questions/concerned were addressed.  It was my pleasure to see  Wendy Adams today  and participate in her care. Please feel free to contact me with any questions or concerns.  Sincerely,  Wyline MoodYoon Dathan Attia, DO Allergy & Immunology  [facility] [place] office: [phone] [facility] [place] office: [phone] [place] office: [phone]

## 2019-08-24 NOTE — Assessment & Plan Note (Signed)
Mild perennial rhinoconjunctivitis symptoms and using Flonase and over-the-counter antihistamines as needed with some benefit.  2017 skin testing was positive to dust mites, cockroach, tree, grass, ragweed, weed, mold and cat.  Continue environmental control measures.  May use over the counter antihistamines such as Zyrtec (cetirizine), Claritin (loratadine), Allegra (fexofenadine), or Xyzal (levocetirizine) daily as needed.  May use Flonase 1 to 2 sprays per nostril once a day as needed for nasal symptoms.

## 2019-08-24 NOTE — Patient Instructions (Addendum)
Eczema:  Start proper skin care as below.  May use triamcinolone 0.5% ointment twice a day as daily to affected areas up to 3 weeks in a row; stop when clear and can restart as needed for flares. Do not use on the face, neck, armpits or groin area. Do not use more than 3 weeks in a row.   Start using a daily antihistamine to help with the itching - May use over the counter antihistamines such as Zyrtec (cetirizine), Claritin (loratadine), Allegra (fexofenadine), or Xyzal (levocetirizine) daily.  Asthma:  May use albuterol rescue inhaler 2 puffs or nebulizer every 4 to 6 hours as needed for shortness of breath, chest tightness, coughing, and wheezing. May use albuterol rescue inhaler 2 puffs 5 to 15 minutes prior to strenuous physical activities. Monitor frequency of use.   Food allergy:  Continue to avoid shellfish, tree nuts, peanuts, peas and eggs. Get bloodwork:  We are ordering labs, so please allow 1-2 weeks for the results to come back. With the newly implemented Cures Act, the labs might be visible to you at the same time that they become visible to me. However, I will not address the results until all of the results are back, so please be patient.  In the meantime, continue recommendations in your patient instructions, including avoidance measures (if applicable), until you hear from me.  Once we have the results, will discuss about food re introduction.  I have prescribed epinephrine injectable and demonstrated proper use. For mild symptoms you can take over the counter antihistamines such as Benadryl and monitor symptoms closely. If symptoms worsen or if you have severe symptoms including breathing issues, throat closure, significant swelling, whole body hives, severe diarrhea and vomiting, lightheadedness then inject epinephrine and seek immediate medical care afterwards.  Food action plan given.   Environmental allergies:  Environmental control measures.  2017 Positive skin  testing was positive to dust mites, cockroach, tree, grass, ragweed, weed, mold and cat.  May use over the counter antihistamines such as Zyrtec (cetirizine), Claritin (loratadine), Allegra (fexofenadine), or Xyzal (levocetirizine) daily as needed.  May use Flonase 1 to 2 sprays per nostril once a day as needed for nasal symptoms.  Follow up in 2 months or sooner if needed.  Skin care recommendations  Bath time: . Always use lukewarm water. AVOID very hot or cold water. Marland Kitchen. Keep bathing time to 5-10 minutes. . Do NOT use bubble bath. . Use a mild soap and use just enough to wash the dirty areas. . Do NOT scrub skin vigorously.  . After bathing, pat dry your skin with a towel. Do NOT rub or scrub the skin.  Moisturizers and prescriptions:  . ALWAYS apply moisturizers immediately after bathing (within 3 minutes). This helps to lock-in moisture. . Use the moisturizer several times a day over the whole body. Peri Jefferson. Good summer moisturizers include: Aveeno, CeraVe, Cetaphil. Peri Jefferson. Good winter moisturizers include: Aquaphor, Vaseline, Cerave, Cetaphil, Eucerin, Vanicream. . When using moisturizers along with medications, the moisturizer should be applied about one hour after applying the medication to prevent diluting effect of the medication or moisturize around where you applied the medications. When not using medications, the moisturizer can be continued twice daily as maintenance.  Laundry and clothing: . Avoid laundry products with added color or perfumes. . Use unscented hypo-allergenic laundry products such as Tide free, Cheer free & gentle, and All free and clear.  . If the skin still seems dry or sensitive, you can try double-rinsing the  clothes. . Avoid tight or scratchy clothing such as wool. . Do not use fabric softeners or dyer sheets.  Control of House Dust Mite Allergen . Dust mite allergens are a common trigger of allergy and asthma symptoms. While they can be found throughout the  house, these microscopic creatures thrive in warm, humid environments such as bedding, upholstered furniture and carpeting. . Because so much time is spent in the bedroom, it is essential to reduce mite levels there.  . Encase pillows, mattresses, and box springs in special allergen-proof fabric covers or airtight, zippered plastic covers.  . Bedding should be washed weekly in hot water (130 F) and dried in a hot dryer. Allergen-proof covers are available for comforters and pillows that can't be regularly washed.  Wendee Copp the allergy-proof covers every few months. Minimize clutter in the bedroom. Keep pets out of the bedroom.  Marland Kitchen Keep humidity less than 50% by using a dehumidifier or air conditioning. You can buy a humidity measuring device called a hygrometer to monitor this.  . If possible, replace carpets with hardwood, linoleum, or washable area rugs. If that's not possible, vacuum frequently with a vacuum that has a HEPA filter. . Remove all upholstered furniture and non-washable window drapes from the bedroom. . Remove all non-washable stuffed toys from the bedroom.  Wash stuffed toys weekly. Reducing Pollen Exposure . Pollen seasons: trees (spring), grass (summer) and ragweed/weeds (fall). Marland Kitchen Keep windows closed in your home and car to lower pollen exposure.  Susa Simmonds air conditioning in the bedroom and throughout the house if possible.  . Avoid going out in dry windy days - especially early morning. . Pollen counts are highest between 5 - 10 AM and on dry, hot and windy days.  . Save outside activities for late afternoon or after a heavy rain, when pollen levels are lower.  . Avoid mowing of grass if you have grass pollen allergy. Marland Kitchen Be aware that pollen can also be transported indoors on people and pets.  . Dry your clothes in an automatic dryer rather than hanging them outside where they might collect pollen.  . Rinse hair and eyes before bedtime. Cockroach Allergen Avoidance Cockroaches  are often found in the homes of densely populated urban areas, schools or commercial buildings, but these creatures can lurk almost anywhere. This does not mean that you have a dirty house or living area. . Block all areas where roaches can enter the home. This includes crevices, wall cracks and windows.  . Cockroaches need water to survive, so fix and seal all leaky faucets and pipes. Have an exterminator go through the house when your family and pets are gone to eliminate any remaining roaches. Marland Kitchen Keep food in lidded containers and put pet food dishes away after your pets are done eating. Vacuum and sweep the floor after meals, and take out garbage and recyclables. Use lidded garbage containers in the kitchen. Wash dishes immediately after use and clean under stoves, refrigerators or toasters where crumbs can accumulate. Wipe off the stove and other kitchen surfaces and cupboards regularly. Mold Control . Mold and fungi can grow on a variety of surfaces provided certain temperature and moisture conditions exist.  . Outdoor molds grow on plants, decaying vegetation and soil. The major outdoor mold, Alternaria and Cladosporium, are found in very high numbers during hot and dry conditions. Generally, a late summer - fall peak is seen for common outdoor fungal spores. Rain will temporarily lower outdoor mold spore count, but  counts rise rapidly when the rainy period ends. . The most important indoor molds are Aspergillus and Penicillium. Dark, humid and poorly ventilated basements are ideal sites for mold growth. The next most common sites of mold growth are the bathroom and the kitchen. Outdoor (Seasonal) Mold Control . Use air conditioning and keep windows closed. . Avoid exposure to decaying vegetation. Marland Kitchen Avoid leaf raking. . Avoid grain handling. . Consider wearing a face mask if working in moldy areas.  Indoor (Perennial) Mold Control  . Maintain humidity below 50%. . Get rid of mold growth on hard  surfaces with water, detergent and, if necessary, 5% bleach (do not mix with other cleaners). Then dry the area completely. If mold covers an area more than 10 square feet, consider hiring an indoor environmental professional. . For clothing, washing with soap and water is best. If moldy items cannot be cleaned and dried, throw them away. . Remove sources e.g. contaminated carpets. . Repair and seal leaking roofs or pipes. Using dehumidifiers in damp basements may be helpful, but empty the water and clean units regularly to prevent mildew from forming. All rooms, especially basements, bathrooms and kitchens, require ventilation and cleaning to deter mold and mildew growth. Avoid carpeting on concrete or damp floors, and storing items in damp areas. Pet Allergen Avoidance: . Contrary to popular opinion, there are no "hypoallergenic" breeds of dogs or cats. That is because people are not allergic to an animal's hair, but to an allergen found in the animal's saliva, dander (dead skin flakes) or urine. Pet allergy symptoms typically occur within minutes. For some people, symptoms can build up and become most severe 8 to 12 hours after contact with the animal. People with severe allergies can experience reactions in public places if dander has been transported on the pet owners' clothing. Marland Kitchen Keeping an animal outdoors is only a partial solution, since homes with pets in the yard still have higher concentrations of animal allergens. . Before getting a pet, ask your allergist to determine if you are allergic to animals. If your pet is already considered part of your family, try to minimize contact and keep the pet out of the bedroom and other rooms where you spend a great deal of time. . As with dust mites, vacuum carpets often or replace carpet with a hardwood floor, tile or linoleum. . High-efficiency particulate air (HEPA) cleaners can reduce allergen levels over time. . While dander and saliva are the source  of cat and dog allergens, urine is the source of allergens from rabbits, hamsters, mice and [place] pigs; so ask a non-allergic family member to clean the animal's cage. . If you have a pet allergy, talk to your allergist about the potential for allergy immunotherapy (allergy shots). This strategy can often provide long-term relief.

## 2019-08-24 NOTE — Assessment & Plan Note (Signed)
Asthma since a young child and currently uses albuterol as needed for exercise-induced symptoms.  Last flare was over a year ago.  Today's spirometry was normal given today's efforts.  May use albuterol rescue inhaler 2 puffs or nebulizer every 4 to 6 hours as needed for shortness of breath, chest tightness, coughing, and wheezing. May use albuterol rescue inhaler 2 puffs 5 to 15 minutes prior to strenuous physical activities. Monitor frequency of use.   Spacer given and demonstrated proper use with inhaler. Patient understood technique and all questions/concerned were addressed.

## 2019-08-25 NOTE — Telephone Encounter (Signed)
Referral was sent on 08/24/2019.

## 2019-08-28 ENCOUNTER — Encounter: Payer: Self-pay | Admitting: Family

## 2019-09-24 ENCOUNTER — Ambulatory Visit (INDEPENDENT_AMBULATORY_CARE_PROVIDER_SITE_OTHER): Payer: Medicaid Other

## 2019-09-24 DIAGNOSIS — Z3042 Encounter for surveillance of injectable contraceptive: Secondary | ICD-10-CM

## 2019-09-24 DIAGNOSIS — Z3049 Encounter for surveillance of other contraceptives: Secondary | ICD-10-CM | POA: Diagnosis not present

## 2019-09-24 MED ORDER — MEDROXYPROGESTERONE ACETATE 150 MG/ML IM SUSP
150.0000 mg | Freq: Once | INTRAMUSCULAR | Status: AC
  Administered 2019-09-24: 150 mg via INTRAMUSCULAR

## 2019-09-30 ENCOUNTER — Encounter: Payer: Self-pay | Admitting: Family

## 2019-10-13 ENCOUNTER — Encounter: Payer: Self-pay | Admitting: Family

## 2019-10-14 ENCOUNTER — Telehealth: Payer: Medicaid Other | Admitting: Pediatrics

## 2019-10-14 ENCOUNTER — Other Ambulatory Visit: Payer: Self-pay

## 2019-10-15 NOTE — Progress Notes (Signed)
Opened in error. No show.

## 2019-10-22 ENCOUNTER — Telehealth: Payer: Self-pay

## 2019-10-22 ENCOUNTER — Other Ambulatory Visit: Payer: Self-pay

## 2019-10-22 ENCOUNTER — Other Ambulatory Visit (INDEPENDENT_AMBULATORY_CARE_PROVIDER_SITE_OTHER): Payer: Medicaid Other

## 2019-10-22 ENCOUNTER — Other Ambulatory Visit: Payer: Self-pay | Admitting: Pediatrics

## 2019-10-22 DIAGNOSIS — Z111 Encounter for screening for respiratory tuberculosis: Secondary | ICD-10-CM

## 2019-10-22 DIAGNOSIS — T7800XD Anaphylactic reaction due to unspecified food, subsequent encounter: Secondary | ICD-10-CM | POA: Diagnosis not present

## 2019-10-22 NOTE — Telephone Encounter (Signed)
Patient needs TB drawn for employment. Orders placed.

## 2019-10-22 NOTE — Progress Notes (Signed)
Patient came in for labs [facility]. Labs ordered by Alfonso Ramus. Successful collection.

## 2019-10-26 ENCOUNTER — Encounter: Payer: Self-pay | Admitting: Family

## 2019-10-26 ENCOUNTER — Ambulatory Visit: Payer: Medicaid Other | Admitting: Allergy

## 2019-10-26 LAB — URINE CULTURE
MICRO NUMBER:: 10116687
SPECIMEN QUALITY:: ADEQUATE

## 2019-10-26 LAB — QUANTIFERON-TB GOLD PLUS

## 2019-10-27 LAB — PEANUT COMPONENTS
F352-IgE Ara h 8: 0.11 kU/L — AB
F422-IgE Ara h 1: <0.1 kU/L
F423-IgE Ara h 2: <0.1 kU/L
F424-IgE Ara h 3: <0.1 kU/L
F427-IgE Ara h 9: <0.1 kU/L
F447-IgE Ara h 6: <0.1 kU/L

## 2019-10-27 LAB — PAT ID TIQ DOC

## 2019-10-27 LAB — ALLERGEN PROFILE, SHELLFISH
Clam IgE: 24 kU/L — AB
F023-IgE Crab: 87.3 kU/L — AB
F080-IgE Lobster: 92.6 kU/L — AB
F290-IgE Oyster: 11.6 kU/L — AB
Scallop IgE: 33.5 kU/L — AB
Shrimp IgE: 88.6 kU/L — AB

## 2019-10-27 LAB — PANEL 604726
Cor A 1 IgE: <0.1 kU/L
Cor A 14 IgE: <0.1 kU/L
Cor A 8 IgE: <0.1 kU/L
Cor A 9 IgE: <0.1 kU/L

## 2019-10-27 LAB — IGE NUT PROF. W/COMPONENT RFLX
F017-IgE Hazelnut (Filbert): 0.4 kU/L — AB
F018-IgE Brazil Nut: <0.1 kU/L
F020-IgE Almond: 0.1 kU/L — AB
F202-IgE Cashew Nut: <0.1 kU/L
F203-IgE Pistachio Nut: 0.14 kU/L — AB
F256-IgE Walnut: <0.1 kU/L
Macadamia Nut, IgE: 0.11 kU/L — AB
Peanut, IgE: 0.16 kU/L — AB
Pecan Nut IgE: <0.1 kU/L

## 2019-10-27 LAB — ALLERGEN COMPONENT COMMENTS

## 2019-10-27 LAB — EXTRA SPECIMEN

## 2019-10-27 LAB — QUANTIFERON-TB GOLD PLUS
Mitogen-NIL: >10 IU/mL
NIL: 0.06 IU/mL
QuantiFERON-TB Gold Plus: NEGATIVE
TB1-NIL: 0.05 IU/mL
TB2-NIL: 0 IU/mL

## 2019-10-27 LAB — ALLERGEN PEA F12: Allergen Green Pea IgE: 16.3 kU/L — AB

## 2019-10-27 LAB — PANEL 603851
F232-IgE Ovalbumin: 0.41 kU/L — AB
F233-IgE Ovomucoid: 0.17 kU/L — AB

## 2019-10-27 LAB — IGE EGG WHITE W/COMPONENT RFLX: F001-IgE Egg White: 1.76 kU/L — AB

## 2019-11-05 ENCOUNTER — Ambulatory Visit: Payer: Medicaid Other | Admitting: Family

## 2019-11-11 ENCOUNTER — Other Ambulatory Visit: Payer: Self-pay

## 2019-11-11 ENCOUNTER — Telehealth: Payer: Self-pay | Admitting: Family

## 2019-11-11 MED ORDER — FLUTICASONE PROPIONATE 50 MCG/ACT NA SUSP: 1.0000 | Freq: Every day | NASAL | 0 refills | Status: DC

## 2019-11-11 MED ORDER — TRIAMCINOLONE ACETONIDE 0.5 % EX OINT: TOPICAL_OINTMENT | CUTANEOUS | 0 refills | Status: DC

## 2019-11-11 NOTE — Telephone Encounter (Signed)
Pre-screening for onsite visit  1. Who is bringing the patient to the visit? Herself   Informed only one adult can bring patient to the visit to limit possible exposure to COVID19 and facemasks must be worn while in the building by the patient (ages 2 and older) and adult.  2. Has the person bringing the patient or the patient been around anyone with suspected or confirmed COVID-19 in the last 14 days? No   3. Has the person bringing the patient or the patient been around anyone who has been tested for COVID-19 in the last 14 days? NO  4. Has the person bringing the patient or the patient had any of these symptoms in the last 14 days?No  Fever (temp 100 F or higher) Breathing problems Cough Sore throat Body aches Chills Vomiting Diarrhea   If all answers are negative, advise patient to call our office prior to your appointment if you or the patient develop any of the symptoms listed above.   If any answers are yes, cancel in-office visit and schedule the patient for a same day telehealth visit with a provider to discuss the next steps.

## 2019-11-12 ENCOUNTER — Ambulatory Visit: Payer: Medicaid Other | Admitting: Family

## 2019-11-12 ENCOUNTER — Other Ambulatory Visit: Payer: Self-pay

## 2019-11-18 ENCOUNTER — Encounter: Payer: Self-pay | Admitting: Family

## 2019-11-23 ENCOUNTER — Ambulatory Visit: Payer: Medicaid Other | Admitting: Pediatrics

## 2019-11-23 ENCOUNTER — Telehealth: Payer: Self-pay | Admitting: Family

## 2019-11-23 NOTE — Telephone Encounter (Signed)
Pre-screening for onsite visit  1. Who is bringing the patient to the visit? Self  Informed only one adult can bring patient to the visit to limit possible exposure to COVID19 and facemasks must be worn while in the building by the patient (ages 2 and older) and adult.  2. Has the person bringing the patient or the patient been around anyone with suspected or confirmed COVID-19 in the last 14 days? No  3. Has the person bringing the patient or the patient been around anyone who has been tested for COVID-19 in the last 14 days? No  4. Has the person bringing the patient or the patient had any of these symptoms in the last 14 days? NO  Fever (temp 100 F or higher) Breathing problems Cough Sore throat Body aches Chills Vomiting Diarrhea Loss of taste or smell   If all answers are negative, advise patient to call our office prior to your appointment if you or the patient develop any of the symptoms listed above.   If any answers are yes, cancel in-office visit and schedule the patient for a same day telehealth visit with a provider to discuss the next steps.

## 2019-12-10 ENCOUNTER — Ambulatory Visit (INDEPENDENT_AMBULATORY_CARE_PROVIDER_SITE_OTHER): Payer: Medicaid Other

## 2019-12-10 ENCOUNTER — Other Ambulatory Visit (HOSPITAL_COMMUNITY)
Admission: RE | Admit: 2019-12-10 | Discharge: 2019-12-10 | Disposition: A | Discharge status: Home / Self Care | Payer: Medicaid Other | Source: Ambulatory Visit | Attending: Family | Admitting: Family

## 2019-12-10 ENCOUNTER — Other Ambulatory Visit: Payer: Self-pay

## 2019-12-10 DIAGNOSIS — Z3042 Encounter for surveillance of injectable contraceptive: Secondary | ICD-10-CM

## 2019-12-10 DIAGNOSIS — Z113 Encounter for screening for infections with a predominantly sexual mode of transmission: Secondary | ICD-10-CM

## 2019-12-10 DIAGNOSIS — Z3049 Encounter for surveillance of other contraceptives: Secondary | ICD-10-CM | POA: Diagnosis not present

## 2019-12-10 MED ORDER — MEDROXYPROGESTERONE ACETATE 150 MG/ML IM SUSP
150.0000 mg | Freq: Once | INTRAMUSCULAR | Status: AC
  Administered 2019-12-10: 150 mg via INTRAMUSCULAR

## 2019-12-10 NOTE — Progress Notes (Addendum)
Pt presents for depo injection. Pt within depo window, no urine hcg needed. Injection given, tolerated well. F/u depo injection visit scheduled. Routing GC/Chl and wet prep collected per patient request. Sent to lab.

## 2019-12-10 NOTE — Addendum Note (Signed)
Addended by: Debroah Loop on: 12/10/2019 09:27 AM   Modules accepted: Orders

## 2019-12-11 ENCOUNTER — Other Ambulatory Visit: Payer: Self-pay | Admitting: Family

## 2019-12-11 LAB — URINE CYTOLOGY ANCILLARY ONLY
Chlamydia: POSITIVE — AB
Comment: NEGATIVE
Comment: NORMAL
Neisseria Gonorrhea: NEGATIVE

## 2019-12-11 MED ORDER — AZITHROMYCIN 500 MG PO TABS: 1000.0000 mg | ORAL_TABLET | Freq: Once | ORAL | 0 refills | Status: AC

## 2019-12-12 LAB — WET PREP BY MOLECULAR PROBE
Candida species: NOT DETECTED
Gardnerella vaginalis: NOT DETECTED
MICRO NUMBER:: 10292482
SPECIMEN QUALITY:: ADEQUATE
Trichomonas vaginosis: NOT DETECTED

## 2019-12-14 ENCOUNTER — Other Ambulatory Visit: Payer: Self-pay | Admitting: Pediatrics

## 2019-12-14 MED ORDER — AZITHROMYCIN 500 MG PO TABS: 1000.0000 mg | ORAL_TABLET | Freq: Once | ORAL | 0 refills | Status: AC

## 2019-12-30 ENCOUNTER — Ambulatory Visit: Payer: Medicaid Other | Attending: Internal Medicine

## 2019-12-30 ENCOUNTER — Other Ambulatory Visit: Payer: Medicaid Other

## 2019-12-30 DIAGNOSIS — Z20822 Contact with and (suspected) exposure to covid-19: Secondary | ICD-10-CM | POA: Diagnosis not present

## 2019-12-31 ENCOUNTER — Encounter: Payer: Self-pay | Admitting: Family

## 2019-12-31 LAB — SARS-COV-2, NAA 2 DAY TAT

## 2019-12-31 LAB — NOVEL CORONAVIRUS, NAA: SARS-CoV-2, NAA: NOT DETECTED

## 2020-01-13 ENCOUNTER — Telehealth: Payer: Self-pay | Admitting: Allergy and Immunology

## 2020-01-13 MED ORDER — FLUTICASONE PROPIONATE 50 MCG/ACT NA SUSP: 1.0000 | Freq: Every day | NASAL | 0 refills | Status: DC

## 2020-01-13 MED ORDER — ALBUTEROL SULFATE HFA 108 (90 BASE) MCG/ACT IN AERS: 2.0000 | INHALATION_SPRAY | RESPIRATORY_TRACT | 0 refills | Status: DC | PRN

## 2020-01-13 NOTE — Telephone Encounter (Signed)
Patient called and needs to have her allergy meds called in she said that are pills and dont know what they are.[facility] [phone].

## 2020-01-13 NOTE — Telephone Encounter (Signed)
Called patient and advised that Dr. Selena Batten wanted her to purchase her antihistamines over the counter and there was never a prescription sent in for these medications. Patient verbalized understanding. She requested refills for her Albuterol inhaler and Flonase. Advised that I could send in courtesy refills and that she would need to schedule a follow up visit with Dr. Selena Batten to receive more refills. Patient verbalized understanding and stated that she will need to check her work schedule and call back to make an appointment.

## 2020-01-18 ENCOUNTER — Other Ambulatory Visit: Payer: Self-pay

## 2020-01-18 ENCOUNTER — Telehealth (INDEPENDENT_AMBULATORY_CARE_PROVIDER_SITE_OTHER): Payer: Medicaid Other | Admitting: Pediatrics

## 2020-01-18 DIAGNOSIS — G44219 Episodic tension-type headache, not intractable: Secondary | ICD-10-CM

## 2020-01-18 DIAGNOSIS — G43109 Migraine with aura, not intractable, without status migrainosus: Secondary | ICD-10-CM

## 2020-01-18 NOTE — Patient Instructions (Addendum)
Migrelief is the medication we talked about.  Pediatric Headache Prevention  1. Begin taking the following Over the Counter Medications that are checked:  ? Potassium-Magnesium Aspartate ([employer]NC Brand) 250 mg  OR  Magnesium Oxide 400mg  Take 1 tablet twice daily. Do not combine with calcium, zinc or iron or take with dairy products.  ? Vitamin B2 (riboflavin) 100 mg tablets. Take 1 tablets twice daily with meals. (May turn urine bright yellow)  ? Melatonin __mg. Take 1-2 hours prior to going to sleep. Get [facility] or [employer] brand; synthetic form  ? Migra-eeze  Amount Per Serving = 2 caps = $17.95/month  Riboflavin (vitamin B2) (as riboflavin and riboflavin 5' phosphate) - 400mg   Butterbur (Petasites hybridus) CO2 Extract (root) [std. to 15% petasins (22.5 mg)] - 150mg   Ginger (Zinigiber officinale) Extract (root) [standardized to 5% gingerols (12.5 mg)] - 250g  ? Migravent   ([email or web address].[email or web address]) Ingredients Amount per 3 capsules - $0.65 per pill = $58.50 per month  [place]ter[place] Extract 150 mg (free of harmful levels of PA's)  Proprietary Blend 876 mg (Riboflavin, Magnesium, Coenzyme Q10 )  Can give one 3 times a day for a month then decrease to 1 twice a day   ? Migrelief   ( )  Ingredients Children's version (<12 y/o) - dose is 2 tabs which delivers amounts below. ~$20 per month. Can double   Magnesium (citrate and oxide) 180mg /day  Riboflavin (Vitamin B2) 200mg /day  PuracolT Feverfew (proprietary extract + whole leaf) 50mg /day (Spanish Matricaria [place]).   2. Dietary changes:  a. EAT REGULAR MEALS- avoid missing meals meaning > 5hrs during the day or >13 hrs overnight.  b. LEARN TO RECOGNIZE TRIGGER FOODS such as: caffeine, cheddar cheese, chocolate, red meat, dairy products, vinegar, bacon, hotdogs, pepperoni, bologna, deli meats, smoked fish, sausages. Food with MSG= dry roasted nuts, food, soy sauce.  3. DRINK PLENTY OF WATER:        64 oz of water  is recommended for adults.  Also be sure to avoid caffeine.   4. GET ADEQUATE REST.  School age children need 9-11 hours of sleep and teenagers need 8-10 hours sleep.  Remember, too much sleep (daytime naps), and too little sleep may trigger headaches. Develop and keep bedtime routines.  5.  RECOGNIZE OTHER CAUSES OF HEADACHE: Address Anxiety, depression, allergy and sinus disease and/or vision problems as these contribute to headaches. Other triggers include over-exertion, loud noise, weather changes, strong odors, secondhand smoke, chemical fumes, motion or travel, medication, hormone changes & monthly cycles.  7. PROVIDE CONSISTENT Daily routines:  exercise, meals, sleep  8. KEEP Headache Diary to record frequency, severity, triggers, and monitor treatments.  9. AVOID OVERUSE of over the counter medications (acetaminophen, ibuprofen, naproxen) to treat headache may result in rebound headaches. Don't take more than 3-4 doses of one medication in a week time.  10. TAKE daily medications as prescribed

## 2020-01-18 NOTE — Progress Notes (Signed)
Virtual Visit via Video Note  I connected with Wendy Adams 's patient  on 01/18/20 at 10:40 AM EDT by a video enabled telemedicine application and verified that I am speaking with the correct person using two identifiers.   Location of patient/parent: home in [place]   I discussed the limitations of evaluation and management by telemedicine and the availability of in person appointments.  I discussed that the purpose of this telehealth visit is to provide medical care while limiting exposure to the novel coronavirus.    I advised the patient  that by engaging in this telehealth visit, they consent to the provision of healthcare.  Additionally, they authorize for the patient's insurance to be billed for the services provided during this telehealth visit.  They expressed understanding and agreed to proceed.  Reason for visit: headaches  History of Present Illness: [name] is a 18yoF who presents with headaches x 1 week. Most of the time, Chavie wakes up without headache. Headache starts around the time she goes to work, eventually headache will go away around afternoon, sometimes headache comes back at night. Kaegan has tried to stay hydrated, which helps a little. She has tried tylenol and advil with no relief. Headache is frontal and bitemporal. She sometimes sees "dots in her eyes" before the headaches come on, last had headaches like this when she was pregnant in 2018. Mom has migraines. No vomiting. No sensitivity to light. Headaches do not awaken her from sleep. No runny nose, cough, or facial pain. No palpitations or feeling like her heart is racing. Frequently skips breakfast. No neck pain.    Observations/Objective:  General: No acute distress Respiratory: Able to speak full sentences without issue Neuro: No facial asymmetry Psych: Normal mood and affect Skin: No rash on face  Assessment and Plan: [name] is a 18yoF who presents with daily headaches x 1.5 weeks. No red flag symptoms, and  she is well-appearing on virtual visit and still able to go to work and complete her daily tasks. 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   - Reviewed importance of hydration, adequate sleep, and not skipping breakfast - Recommended trial of Migrelief daily - Recommended keeping a headache diary - Reviewed avoiding overuse of tylenol and ibuprofen (don't take more than 3-4 doses of one med in a week's time) - RTC for new/worsening symptoms - [name] has a history of thyroid abnormalities. Currently she is not having symptoms to suggest hyperthyroidism, but [facility] (her listed PCP) is working to transfer her primary care to [facility], at which time she would benefit from trending TFTs.  Follow Up Instructions: PRN   I discussed the assessment and treatment plan with the patient and/or parent/guardian. They were provided an opportunity to ask questions and all were answered. They agreed with the plan and demonstrated an understanding of the instructions.   They were advised to call back or seek an in-person evaluation in the emergency room if the symptoms worsen or if the condition fails to improve as anticipated.  Time spent reviewing chart in preparation for visit:  5 minutes Time spent face-to-face with patient: 20 minutes Time spent not face-to-face with patient for documentation and care coordination on date of service: 10 minutes  I was located at [facility] during this encounter.  Margot Chimes, MD

## 2020-01-21 ENCOUNTER — Encounter: Payer: Self-pay | Admitting: Family

## 2020-01-22 ENCOUNTER — Other Ambulatory Visit: Payer: Self-pay

## 2020-01-22 ENCOUNTER — Ambulatory Visit (INDEPENDENT_AMBULATORY_CARE_PROVIDER_SITE_OTHER): Payer: Medicaid Other | Admitting: Pediatrics

## 2020-01-22 VITALS — Temp 96.8°F | Wt 116.0 lb

## 2020-01-22 DIAGNOSIS — L739 Follicular disorder, unspecified: Secondary | ICD-10-CM

## 2020-01-22 DIAGNOSIS — N368 Other specified disorders of urethra: Secondary | ICD-10-CM | POA: Diagnosis not present

## 2020-01-22 NOTE — Progress Notes (Signed)
   Subjective:     Wendy Adams, is a 19 y.o. female   History provider by patient No interpreter necessary.  Chief Complaint  Patient presents with  . bump under labia    2 days sx, no fever or drainage. first time event.    HPI: Patient reports that 2 days ago, she developed 2 bouts in the vulvar area.  1 pump is located on the left labia minora.  She has never had this before, and it is slightly painful.  She denies any drainage or fever.  She says that it has decreased in size since she has been applying warm compresses.  The second bump is in the perineal area and is also like slightly painful.  She also denies any drainage from this area.  She does shave her pubic area.  <<For Level 3, ROS includes problem pertinent>>  Review of Systems   Patient's history was reviewed and updated as appropriate: allergies, current medications, past family history, past medical history, past social history, past surgical history and problem list.     Objective:     Temp (!) 96.8 F (36 C) (Temporal)   Wt 116 lb (52.6 kg)   BMI 20.48 kg/m   Physical Exam Exam conducted with a chaperone present.  Constitutional:      Appearance: Normal appearance.  HENT:     Head: Normocephalic and atraumatic.  Genitourinary:    Labia:        Left: Tenderness present.      Comments: Slightly swollen area 3 mm in diameter on the left labia minora without fluctuance or erythema.  Slightly tender to palpation.  In the perineum without fluctuance, slightly tender to palpation.  No drainage. Skin:    Findings: No erythema or rash.  Neurological:     Mental Status: She is alert.        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  It has also reduced in size after compress, which corresponds to this diagnosis as well.  No evidence for abscess formation.  Patient counseled on continuing warm compresses and using  Tylenol or Motrin for pain.  She was given return precautions including increased redness, swelling, pain, fever, and drainage.  Folliculitis Lesion on perineum is most consistent with folliculitis.  Patient was counseled on abstaining from shaving the area if possible or using a hair trimmer instead.  She was counseled that warm compresses can also help this area heal.  She was given return precautions as above.  Supportive care and return precautions reviewed.  No follow-ups on file.  Lennox Solders, MD

## 2020-01-22 NOTE — Patient Instructions (Signed)
It was nice meeting you today Wendy Adams!  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  The other bump is likely due to irritation of the hair follicle, called folliculitis.  The best way to avoid folliculitis is to stop shaving or just trim the hair.  Warm compresses can also help this area heal.  If you begin to have increased redness, swelling, pain, or fever, please make an appointment to be reevaluated.  However, I think these will likely get better with time.  Please also call [phone] to make an appointment as a new patient at the [facility].  We would love to have you as a patient in our clinic.  If you have any questions or concerns, please feel free to call the clinic.   Be well,  Dr. Frances Furbish

## 2020-01-25 ENCOUNTER — Encounter: Payer: Self-pay | Admitting: Family

## 2020-01-26 ENCOUNTER — Encounter: Payer: Self-pay | Admitting: Family

## 2020-01-28 ENCOUNTER — Ambulatory Visit: Payer: Medicaid Other | Admitting: Family

## 2020-02-01 ENCOUNTER — Encounter: Payer: Self-pay | Admitting: Family

## 2020-02-10 ENCOUNTER — Encounter: Payer: Self-pay | Admitting: Family

## 2020-02-25 ENCOUNTER — Ambulatory Visit (INDEPENDENT_AMBULATORY_CARE_PROVIDER_SITE_OTHER): Payer: Medicaid Other

## 2020-02-25 ENCOUNTER — Encounter: Payer: Self-pay | Admitting: Family

## 2020-02-25 ENCOUNTER — Other Ambulatory Visit (HOSPITAL_COMMUNITY)
Admission: RE | Admit: 2020-02-25 | Discharge: 2020-02-25 | Disposition: A | Discharge status: Home / Self Care | Payer: Medicaid Other | Source: Ambulatory Visit | Attending: Family | Admitting: Family

## 2020-02-25 DIAGNOSIS — Z113 Encounter for screening for infections with a predominantly sexual mode of transmission: Secondary | ICD-10-CM

## 2020-02-25 DIAGNOSIS — Z3049 Encounter for surveillance of other contraceptives: Secondary | ICD-10-CM | POA: Diagnosis not present

## 2020-02-25 DIAGNOSIS — Z3042 Encounter for surveillance of injectable contraceptive: Secondary | ICD-10-CM

## 2020-02-25 MED ORDER — MEDROXYPROGESTERONE ACETATE 150 MG/ML IM SUSP
150.0000 mg | Freq: Once | INTRAMUSCULAR | Status: AC
  Administered 2020-02-25: 150 mg via INTRAMUSCULAR

## 2020-02-25 NOTE — Addendum Note (Signed)
Addended by: Debroah Loop on: 02/25/2020 11:17 AM   Modules accepted: Orders

## 2020-02-25 NOTE — Progress Notes (Signed)
Pt presents for depo injection. Pt within depo window, no urine hcg needed. Injection given, tolerated well. F/u depo injection visit scheduled.   

## 2020-02-26 LAB — URINE CYTOLOGY ANCILLARY ONLY
Chlamydia: POSITIVE — AB
Comment: NEGATIVE
Comment: NEGATIVE
Comment: NORMAL
Neisseria Gonorrhea: NEGATIVE
Trichomonas: NEGATIVE

## 2020-02-29 ENCOUNTER — Ambulatory Visit (INDEPENDENT_AMBULATORY_CARE_PROVIDER_SITE_OTHER): Payer: Medicaid Other | Admitting: Family Medicine

## 2020-02-29 DIAGNOSIS — Z5329 Procedure and treatment not carried out because of patient's decision for other reasons: Secondary | ICD-10-CM

## 2020-02-29 NOTE — Progress Notes (Signed)
No Show  Tanya Walsh, MD Family Medicine [facility]  

## 2020-03-09 ENCOUNTER — Encounter: Payer: Self-pay | Admitting: Family

## 2020-03-09 ENCOUNTER — Other Ambulatory Visit: Payer: Self-pay | Admitting: Family

## 2020-03-09 MED ORDER — AZITHROMYCIN 500 MG PO TABS: 1000.0000 mg | ORAL_TABLET | Freq: Once | ORAL | 0 refills | Status: AC

## 2020-03-18 ENCOUNTER — Ambulatory Visit: Payer: Medicaid Other

## 2020-03-22 ENCOUNTER — Telehealth: Payer: Self-pay | Admitting: General Practice

## 2020-03-22 ENCOUNTER — Ambulatory Visit: Payer: Medicaid Other | Admitting: Pediatrics

## 2020-03-22 DIAGNOSIS — A749 Chlamydial infection, unspecified: Secondary | ICD-10-CM | POA: Diagnosis not present

## 2020-03-22 DIAGNOSIS — N898 Other specified noninflammatory disorders of vagina: Secondary | ICD-10-CM | POA: Diagnosis not present

## 2020-03-22 DIAGNOSIS — Z113 Encounter for screening for infections with a predominantly sexual mode of transmission: Secondary | ICD-10-CM | POA: Diagnosis not present

## 2020-03-22 DIAGNOSIS — J4520 Mild intermittent asthma, uncomplicated: Secondary | ICD-10-CM | POA: Diagnosis not present

## 2020-03-22 NOTE — Telephone Encounter (Signed)
Patient called and said that she needs to now when does she need her next depo?

## 2020-04-20 DIAGNOSIS — E0590 Thyrotoxicosis, unspecified without thyrotoxic crisis or storm: Secondary | ICD-10-CM | POA: Diagnosis not present

## 2020-04-20 DIAGNOSIS — Z113 Encounter for screening for infections with a predominantly sexual mode of transmission: Secondary | ICD-10-CM | POA: Diagnosis not present

## 2020-04-20 DIAGNOSIS — A549 Gonococcal infection, unspecified: Secondary | ICD-10-CM | POA: Diagnosis not present

## 2020-05-12 ENCOUNTER — Ambulatory Visit: Payer: Medicaid Other

## 2020-05-24 DIAGNOSIS — Z113 Encounter for screening for infections with a predominantly sexual mode of transmission: Secondary | ICD-10-CM | POA: Diagnosis not present

## 2020-05-24 DIAGNOSIS — Z3202 Encounter for pregnancy test, result negative: Secondary | ICD-10-CM | POA: Diagnosis not present

## 2020-05-25 ENCOUNTER — Ambulatory Visit
Admission: EM | Admit: 2020-05-25 | Discharge: 2020-05-25 | Discharge status: Against Medical Advice | Payer: Medicaid Other

## 2020-05-26 ENCOUNTER — Ambulatory Visit: Payer: Self-pay

## 2020-07-06 DIAGNOSIS — N760 Acute vaginitis: Secondary | ICD-10-CM | POA: Diagnosis not present

## 2020-07-15 DIAGNOSIS — Z3042 Encounter for surveillance of injectable contraceptive: Secondary | ICD-10-CM | POA: Diagnosis not present

## 2020-09-23 DIAGNOSIS — Z3042 Encounter for surveillance of injectable contraceptive: Secondary | ICD-10-CM | POA: Diagnosis not present

## 2020-10-04 DIAGNOSIS — Z113 Encounter for screening for infections with a predominantly sexual mode of transmission: Secondary | ICD-10-CM | POA: Diagnosis not present

## 2020-10-04 DIAGNOSIS — L910 Hypertrophic scar: Secondary | ICD-10-CM | POA: Diagnosis not present

## 2020-11-23 DIAGNOSIS — N898 Other specified noninflammatory disorders of vagina: Secondary | ICD-10-CM | POA: Diagnosis not present

## 2020-12-02 DIAGNOSIS — Z3042 Encounter for surveillance of injectable contraceptive: Secondary | ICD-10-CM | POA: Diagnosis not present

## 2021-01-29 ENCOUNTER — Emergency Department (HOSPITAL_COMMUNITY)
Admission: EM | Admit: 2021-01-29 | Discharge: 2021-01-29 | Discharge status: Against Medical Advice | Payer: Medicaid Other

## 2021-01-29 NOTE — ED Notes (Signed)
Patient called multiples times for triage without answer

## 2021-02-19 ENCOUNTER — Encounter: Payer: Self-pay | Admitting: Emergency Medicine

## 2021-02-19 ENCOUNTER — Other Ambulatory Visit: Payer: Self-pay

## 2021-02-19 ENCOUNTER — Ambulatory Visit
Admission: EM | Admit: 2021-02-19 | Discharge: 2021-02-19 | Disposition: A | Discharge status: Home / Self Care | Payer: Medicaid Other | Attending: Nurse Practitioner | Admitting: Nurse Practitioner

## 2021-02-19 DIAGNOSIS — Z711 Person with feared health complaint in whom no diagnosis is made: Secondary | ICD-10-CM | POA: Insufficient documentation

## 2021-02-19 DIAGNOSIS — Z202 Contact with and (suspected) exposure to infections with a predominantly sexual mode of transmission: Secondary | ICD-10-CM | POA: Insufficient documentation

## 2021-02-19 LAB — POCT URINALYSIS DIP (MANUAL ENTRY)
Bilirubin, UA: NEGATIVE
Glucose, UA: NEGATIVE mg/dL
Ketones, POC UA: NEGATIVE mg/dL
Nitrite, UA: NEGATIVE
Protein Ur, POC: NEGATIVE mg/dL
Spec Grav, UA: 1.025 (ref 1.010–1.025)
Urobilinogen, UA: 1 E.U./dL
pH, UA: 7 (ref 5.0–8.0)

## 2021-02-19 LAB — POCT URINE PREGNANCY: Preg Test, Ur: NEGATIVE

## 2021-02-19 NOTE — ED Provider Notes (Signed)
[facility]    CSN: [id number] Arrival date & time: 02/19/21  0813      History   Chief Complaint Chief Complaint  Patient presents with  . Exposure to STD    HPI Wendy Adams is a 20 y.o. female.   Subjective:   Wendy Adams is a 20 y.o. female who presents for evaluation of possible STD exposure. Patient currently denies any symptoms. Patient reports that her boyfriend stated that he "didn't feel right" and went to another urgent care earlier today for STD testing. She states that "everything came back ok but I still want to get checked." Patient denies any history of STDs. She has no symptoms currently. She is sexually active with one female partner. She does not use any barrier methods.   The following portions of the patient's history were reviewed and updated as appropriate: allergies, current medications, past family history, past medical history, past social history, past surgical history and problem list.         Past Medical History:  Diagnosis Date  . Allergy   . Angio-edema   . Asthma   . Eczema   . Headache(784.0)   . Multiple allergies     Patient Active Problem List   Diagnosis Date Noted  . Mild intermittent asthma without complication 08/24/2019  . Seasonal and perennial allergic rhinitis 08/24/2019  . Asthma, well controlled 04/07/2018  . Seasonal allergies 04/07/2018  . Menstrual cramps 04/07/2018  . Hyperthyroidism 02/26/2018  . Abnormal thyroid blood test 01/25/2018  . Goiter 01/25/2018  . Unintentional weight loss 01/25/2018  . Normal vaginal delivery 03/08/2017  . Perennial and seasonal allergic rhinoconjunctivitis 12/02/2015  . Moderate persistent asthma 12/07/2013  . Other atopic dermatitis 12/07/2013  . 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 NO PAST SURGERIES      OB History    Gravida  1   Para  1   Term  1   Preterm       AB      Living  1     SAB      IAB      Ectopic      Multiple  0   Live Births  1            Home Medications    Prior to Admission medications   Medication Sig Start Date End Date Taking? Authorizing Provider  EPINEPHrine 0.3 mg/0.3 mL IJ SOAJ injection Inject 0.3 mLs (0.3 mg total) into the muscle as needed for anaphylaxis. 08/24/19  Yes Ellamae Sia, DO  albuterol (VENTOLIN HFA) 108 (90 Base) MCG/ACT inhaler Inhale 2 puffs into the lungs every 4 (four) hours as needed for wheezing or shortness of breath (coughing). Patient not taking: Reported on 01/22/2020 01/13/20   Ellamae Sia, DO  fluticasone [facility]) 50 MCG/ACT nasal spray Place 1-2 sprays into both nostrils daily. Patient not taking: Reported on 01/22/2020 01/13/20   Ellamae Sia, DO  polyethylene glycol powder (GLYCOLAX/MIRALAX) 17 GM/SCOOP powder Take 17 g by mouth daily. Take 1 cap or 17 g by mouth daily. Mix with 8 oz of water. Patient not taking: Reported on 01/22/2020 01/28/19   Aida Raider, MD  triamcinolone ointment (KENALOG) 0.5 % Apply twice a day to roughened skin area. Do not use on the face, neck, armpits or groin area. Do not use more than 3 weeks in a  row. Patient not taking: Reported on 01/22/2020 11/11/19   Ellamae Sia, DO    Family History Family History  Problem Relation Age of Onset  . Asthma Father   . Eczema Father   . Diabetes Maternal Grandmother   . Lupus Maternal Grandmother   . Diabetes Maternal Grandfather   . Migraines Mother   . Alcohol abuse Neg Hx   . Arthritis Neg Hx   . Birth defects Neg Hx   . Cancer Neg Hx   . COPD Neg Hx   . Depression Neg Hx   . Drug abuse Neg Hx   . Early death Neg Hx   . Hearing loss Neg Hx   . Heart disease Neg Hx   . Hyperlipidemia Neg Hx   . Hypertension Neg Hx   . Kidney disease Neg Hx   . Learning disabilities Neg Hx   . Mental illness Neg Hx   . Mental retardation Neg Hx   . Vision loss Neg Hx   . Allergic rhinitis Neg Hx   .  Angioedema Neg Hx   . Atopy Neg Hx   . Immunodeficiency Neg Hx   . Urticaria Neg Hx   . Cystic fibrosis Neg Hx   . Emphysema Neg Hx     Social History Social History   Tobacco Use  . Smoking status: Never Smoker  . Smokeless tobacco: Never Used  Vaping Use  . Vaping Use: Never used  Substance Use Topics  . Alcohol use: No  . Drug use: No     Allergies   Food, Peanut oil, Beef-derived products, Chicken protein, Eggs or egg-derived products, Pork-derived products, and Shellfish allergy   Review of Systems Review of Systems  Constitutional: Negative for fever.  Gastrointestinal: Negative for nausea and vomiting.  Genitourinary: Negative for dysuria, flank pain, frequency, genital sores and vaginal discharge.  Musculoskeletal: Negative for back pain.  All other systems reviewed and are negative.    Physical Exam Triage Vital Signs ED Triage Vitals  Enc Vitals Group     BP 02/19/21 0821 103/65     Pulse Rate 02/19/21 0821 92     Resp --      Temp 02/19/21 0821 98.2 F (36.8 C)     Temp Source 02/19/21 0821 Oral     SpO2 02/19/21 0821 97 %     Weight --      Height --      Head Circumference --      Peak Flow --      Pain Score 02/19/21 0822 0     Pain Loc --      Pain Edu? --      Excl. in GC? --    No data found.  Updated Vital Signs BP 103/65 (BP Location: Left Arm)   Pulse 92   Temp 98.2 F (36.8 C) (Oral)   SpO2 97%   Visual Acuity Right Eye Distance:   Left Eye Distance:   Bilateral Distance:    Right Eye Near:   Left Eye Near:    Bilateral Near:     Physical Exam Vitals reviewed.  Constitutional:      Appearance: Normal appearance.  HENT:     Head: Normocephalic.  Cardiovascular:     Rate and Rhythm: Normal rate and regular rhythm.  Pulmonary:     Effort: Pulmonary effort is normal.  Musculoskeletal:        General: Normal range of motion.     Cervical back:  Normal range of motion and neck supple.  Skin:    General: Skin is warm  and dry.  Neurological:     General: No focal deficit present.     Mental Status: She is alert and oriented to person, place, and time.  Psychiatric:        Mood and Affect: Mood normal.      UC Treatments / Results  Labs (all labs ordered are listed, but only abnormal results are displayed) Labs Reviewed  POCT URINALYSIS DIP (MANUAL ENTRY) - Abnormal; Notable for the following components:      Result Value   Clarity, UA cloudy (*)    Blood, UA trace-intact (*)    Leukocytes, UA Small (1+) (*)    All other components within normal limits  URINE CULTURE  POCT URINE PREGNANCY  CERVICOVAGINAL ANCILLARY ONLY    EKG   Radiology No results found.  Procedures Procedures (including critical care time)  Medications Ordered in UC Medications - No data to display  Initial Impression / Assessment and Plan / UC Course  I have reviewed the triage vital signs and the nursing notes.  Pertinent labs & imaging results that were available during my care of the patient were reviewed by me and considered in my medical decision making (see chart for details).    20 year old female presenting for possible STD exposure.  She is currently asymptomatic.  She is sexually active with 1 female partner.  She does not use condoms.  Urine pregnancy negative.  Small amount of leukocytes noted in urine.  We will wait on urine cultures to determine the need for treatment as patient does not have any urinary complaints.  Testing for BV, yeast, trichomonas, gonorrhea and chlamydia currently pending.  Patient advised to abstain from sexual activity until test results are returned.  Will treat appropriately based on these results.  Safe sex practices discussed and encouraged.  Today's evaluation has revealed no signs of a dangerous process. Discussed diagnosis with patient and/or guardian. Patient and/or guardian aware of their diagnosis, possible red flag symptoms to watch out for and need for close follow up.  Patient and/or guardian understands verbal and written discharge instructions. Patient and/or guardian comfortable with plan and disposition.  Patient and/or guardian has a clear mental status at this time, good insight into illness (after discussion and teaching) and has clear judgment to make decisions regarding their care  This care was provided during an unprecedented National Emergency due to the Novel Coronavirus (COVID-19) pandemic. COVID-19 infections and transmission risks place heavy strains on healthcare resources.  As this pandemic evolves, our facility, providers, and staff strive to respond fluidly, to remain operational, and to provide care relative to available resources and information. Outcomes are unpredictable and treatments are without well-defined guidelines. Further, the impact of COVID-19 on all aspects of urgent care, including the impact to patients seeking care for reasons other than COVID-19, is unavoidable during this national emergency. At this time of the global pandemic, management of patients has significantly changed, even for non-COVID positive patients given high local and regional COVID volumes at this time requiring high healthcare system and resource utilization. The standard of care for management of both COVID suspected and non-COVID suspected patients continues to change rapidly at the local, regional, national, and global levels. This patient was worked up and treated to the best available but ever changing evidence and resources available at this current time.   Documentation was completed with the aid of voice recognition  software. Transcription may contain typographical errors. Final Clinical Impressions(s) / UC Diagnoses   Final diagnoses:  Exposure to STD  Possible exposure to STD  Concern about STD in female without diagnosis   Discharge Instructions   None    ED Prescriptions    None     PDMP not reviewed this encounter.   Lurline Idol,  [place] 02/19/21 [phone]

## 2021-02-19 NOTE — ED Triage Notes (Signed)
Patient states that her boyfriend had STD testing and everything was negative.  Patient would like testing.  No sx's.

## 2021-02-20 LAB — CERVICOVAGINAL ANCILLARY ONLY
Bacterial Vaginitis (gardnerella): NEGATIVE
Candida Glabrata: NEGATIVE
Candida Vaginitis: POSITIVE — AB
Chlamydia: NEGATIVE
Comment: NEGATIVE
Comment: NEGATIVE
Comment: NEGATIVE
Comment: NEGATIVE
Comment: NEGATIVE
Comment: NORMAL
Neisseria Gonorrhea: NEGATIVE
Trichomonas: NEGATIVE

## 2021-02-20 LAB — URINE CULTURE
Culture: <10000 — AB
Special Requests: NORMAL

## 2021-02-22 ENCOUNTER — Telehealth (HOSPITAL_COMMUNITY): Payer: Self-pay | Admitting: Emergency Medicine

## 2021-02-22 MED ORDER — FLUCONAZOLE 150 MG PO TABS: 150.0000 mg | ORAL_TABLET | Freq: Once | ORAL | 0 refills | Status: AC

## 2021-03-23 ENCOUNTER — Ambulatory Visit (HOSPITAL_COMMUNITY)
Admission: EM | Admit: 2021-03-23 | Discharge: 2021-03-23 | Disposition: A | Discharge status: Home / Self Care | Payer: Medicaid Other | Attending: Internal Medicine | Admitting: Internal Medicine

## 2021-03-23 ENCOUNTER — Telehealth (HOSPITAL_COMMUNITY): Payer: Self-pay | Admitting: Emergency Medicine

## 2021-03-23 ENCOUNTER — Other Ambulatory Visit: Payer: Self-pay

## 2021-03-23 ENCOUNTER — Encounter (HOSPITAL_COMMUNITY): Payer: Self-pay

## 2021-03-23 DIAGNOSIS — Z3042 Encounter for surveillance of injectable contraceptive: Secondary | ICD-10-CM | POA: Diagnosis not present

## 2021-03-23 DIAGNOSIS — N898 Other specified noninflammatory disorders of vagina: Secondary | ICD-10-CM | POA: Diagnosis not present

## 2021-03-23 DIAGNOSIS — Z8619 Personal history of other infectious and parasitic diseases: Secondary | ICD-10-CM | POA: Diagnosis not present

## 2021-03-23 DIAGNOSIS — Z3202 Encounter for pregnancy test, result negative: Secondary | ICD-10-CM

## 2021-03-23 DIAGNOSIS — Z113 Encounter for screening for infections with a predominantly sexual mode of transmission: Secondary | ICD-10-CM

## 2021-03-23 LAB — POC URINE PREG, ED: Preg Test, Ur: NEGATIVE

## 2021-03-23 MED ORDER — CEFTRIAXONE SODIUM 500 MG IJ SOLR
INTRAMUSCULAR | Status: AC
  Filled 2021-03-23: qty 500

## 2021-03-23 MED ORDER — LIDOCAINE HCL (PF) 1 % IJ SOLN
INTRAMUSCULAR | Status: AC
  Filled 2021-03-23: qty 2

## 2021-03-23 MED ORDER — DOXYCYCLINE HYCLATE 100 MG PO CAPS: 100.0000 mg | ORAL_CAPSULE | Freq: Two times a day (BID) | ORAL | 0 refills | Status: AC

## 2021-03-23 MED ORDER — CEFTRIAXONE SODIUM 500 MG IJ SOLR: 500.0000 mg | Freq: Once | INTRAMUSCULAR | Status: DC

## 2021-03-23 NOTE — ED Notes (Signed)
Did attempt to call patient with number listed for patient's mobile.  Went directly to [employer].  No identifying information on voicemail and did not leave a message

## 2021-03-23 NOTE — ED Triage Notes (Signed)
Pt presents for STD and pregnancy testing.  Denies sxs and states she wants to be treated in case she has an STD.

## 2021-03-23 NOTE — ED Provider Notes (Addendum)
[facility]    CSN: [id number] Arrival date & time: 03/23/21  5643      History   Chief Complaint Chief Complaint  Patient presents with   SEXUALLY TRANSMITTED DISEASE   Possible Pregnancy    HPI Wendy Adams is a 20 y.o. female presenting for pregnancy testing and STD testing.  Medical history asthma, angioedema, chlamydia x5, trichomonas, vaginal candidiasis.  Endorses few weeks of clear vaginal discharge, she first is concerned for STI given her history of this.  Same female partner.  Irregular periods on Depo, so also requesting pregnancy test today. Denies hematuria, dysuria, frequency, urgency, back pain, n/v/d/abd pain, fevers/chills, abdnormal vaginal lesions/rashes.    HPI  Past Medical History:  Diagnosis Date   Allergy    Angio-edema    Asthma    Eczema    Headache(784.0)    Multiple allergies     Patient Active Problem List   Diagnosis Date Noted   Mild intermittent asthma without complication 08/24/2019   Seasonal and perennial allergic rhinitis 08/24/2019   Asthma, well controlled 04/07/2018   Seasonal allergies 04/07/2018   Menstrual cramps 04/07/2018   Hyperthyroidism 02/26/2018   Abnormal thyroid blood test 01/25/2018   Goiter 01/25/2018   Unintentional weight loss 01/25/2018   Normal vaginal delivery 03/08/2017   Perennial and seasonal allergic rhinoconjunctivitis 12/02/2015   Moderate persistent asthma 12/07/2013   Other atopic dermatitis 12/07/2013   Allergy with anaphylaxis due to food, 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 Authorizing Provider  doxycycline (VIBRAMYCIN) 100 MG  capsule Take 1 capsule (100 mg total) by mouth 2 (two) times daily for 7 days. 03/23/21 03/30/21 Yes Rhys Martini, PA-C  EPINEPHrine 0.3 mg/0.3 mL IJ SOAJ injection Inject 0.3 mLs (0.3 mg total) into the muscle as needed for anaphylaxis. 08/24/19   Ellamae Sia, DO    Family History Family History  Problem Relation Age of Onset   Asthma Father    Eczema Father    Diabetes Maternal Grandmother    Lupus Maternal Grandmother    Diabetes Maternal Grandfather    Migraines Mother    Alcohol abuse Neg Hx    Arthritis Neg Hx    Birth defects Neg Hx    Cancer Neg Hx    COPD Neg Hx    Depression Neg Hx    Drug abuse Neg Hx    Early death Neg Hx    Hearing loss Neg Hx    Heart disease Neg Hx    Hyperlipidemia Neg Hx    Hypertension Neg Hx    Kidney disease Neg Hx    Learning disabilities Neg Hx    Mental illness Neg Hx    Mental retardation Neg Hx    Vision loss Neg Hx    Allergic rhinitis Neg Hx    Angioedema Neg  Hx    Atopy Neg Hx    Immunodeficiency Neg Hx    Urticaria Neg Hx    Cystic fibrosis Neg Hx    Emphysema Neg Hx     Social History Social History   Tobacco Use   Smoking status: Never   Smokeless tobacco: Never  Vaping Use   Vaping Use: Never used  Substance Use Topics   Alcohol use: No   Drug use: No     Allergies   Food, Peanut oil, Beef-derived products, Chicken protein, Eggs or egg-derived products, Pork-derived products, and Shellfish allergy   Review of Systems Review of Systems  Constitutional:  Negative for chills and fever.  HENT:  Negative for sore throat.   Eyes:  Negative for pain and redness.  Respiratory:  Negative for shortness of breath.   Cardiovascular:  Negative for chest pain.  Gastrointestinal:  Negative for abdominal pain, diarrhea, nausea and vomiting.  Genitourinary:  Positive for vaginal discharge. Negative for decreased urine volume, difficulty urinating, dysuria, flank pain, frequency, genital sores, hematuria and urgency.   Musculoskeletal:  Negative for back pain.  Skin:  Negative for rash.  All other systems reviewed and are negative.   Physical Exam Triage Vital Signs ED Triage Vitals  Enc Vitals Group     BP 03/23/21 0838 125/73     Pulse Rate 03/23/21 0838 89     Resp 03/23/21 0838 17     Temp 03/23/21 0838 98.1 F (36.7 C)     Temp Source 03/23/21 0838 Oral     SpO2 03/23/21 0838 100 %     Weight --      Height --      Head Circumference --      Peak Flow --      Pain Score 03/23/21 0836 0     Pain Loc --      Pain Edu? --      Excl. in GC? --    No data found.  Updated Vital Signs BP 125/73 (BP Location: Right Arm)   Pulse 89   Temp 98.1 F (36.7 C) (Oral)   Resp 17   SpO2 100%   Visual Acuity Right Eye Distance:   Left Eye Distance:   Bilateral Distance:    Right Eye Near:   Left Eye Near:    Bilateral Near:     Physical Exam Vitals reviewed.  Constitutional:      General: She is not in acute distress.    Appearance: Normal appearance. She is not ill-appearing.  HENT:     Head: Normocephalic and atraumatic.     Mouth/Throat:     Mouth: Mucous membranes are moist.     Comments: Moist mucous membranes Eyes:     Extraocular Movements: Extraocular movements intact.     Pupils: Pupils are equal, round, and reactive to light.  Cardiovascular:     Rate and Rhythm: Normal rate and regular rhythm.     Heart sounds: Normal heart sounds.  Pulmonary:     Effort: Pulmonary effort is normal.     Breath sounds: Normal breath sounds. No wheezing, rhonchi or rales.  Abdominal:     General: Bowel sounds are normal. There is no distension.     Palpations: Abdomen is soft. There is no mass.     Tenderness: There is no abdominal tenderness. There is no right CVA tenderness, left CVA tenderness, guarding or rebound.  Genitourinary:    Comments: deferred Skin:    General: Skin  is warm.     Capillary Refill: Capillary refill takes less than 2 seconds.     Comments: Good skin  turgor  Neurological:     General: No focal deficit present.     Mental Status: She is alert and oriented to person, place, and time.  Psychiatric:        Mood and Affect: Mood normal.        Behavior: Behavior normal.     UC Treatments / Results  Labs (all labs ordered are listed, but only abnormal results are displayed) Labs Reviewed  POC URINE PREG, ED  POC URINE PREG, ED  CERVICOVAGINAL ANCILLARY ONLY    EKG   Radiology No results found.  Procedures Procedures (including critical care time)  Medications Ordered in UC Medications  cefTRIAXone (ROCEPHIN) injection 500 mg (has no administration in time range)    Initial Impression / Assessment and Plan / UC Course  I have reviewed the triage vital signs and the nursing notes.  Pertinent labs & imaging results that were available during my care of the patient were reviewed by me and considered in my medical decision making (see chart for details).     This patient is a very pleasant 20 y.o. year old female presenting with concern for STI. Afebrile, nontachy, no reproducible abd pain or CVAT.   H/o chlamydia x5, trichomonas x1. Self swab sent for BV, yeast, gonorrhea, chlamydia, trichomonas.  Declines HIV, RPR. Negative urine pregnancy today.  I have low suspicion for STI based on symptoms and negative STI panel 1 month ago, but the patient expresses concern for this.  Given her history of multiple STIs in the past, I am amenable to treating with Rocephin and doxycycline today. *Patient left facility without being discharged, before she received the Rocephin. Safe sex precautions.  ED return precautions discussed. Patient verbalizes understanding and agreement.   Coding this visit a Level 4 for review of past notes and labs (STI testing for last 4 years), ordering and interpreting of labs today, and prescription drug management.  Final Clinical Impressions(s) / UC Diagnoses   Final diagnoses:  Vaginal discharge   Routine screening for STI (sexually transmitted infection)  Depot contraception  Negative pregnancy test  History of chlamydia  History of trichomoniasis     Discharge Instructions      -We treated you for gonorrhea today with a shot of antibiotic called Rocephin. -Doxycycline twice daily for 7 days. This is the treatment for chlamydia.  Make sure to wear sunscreen while spending time outside while on this medication as it can increase your chance of sunburn. You can take this medication with food if you have a sensitive stomach.   -We are testing you for gonorrhea, chlamydia, trichomonas, BV, yeast.  Abstain from intercourse until negative result or until you have completed treatment.      ED Prescriptions     Medication Sig Dispense Auth. Provider   doxycycline (VIBRAMYCIN) 100 MG capsule Take 1 capsule (100 mg total) by mouth 2 (two) times daily for 7 days. 14 capsule Rhys Martini, PA-C      PDMP not reviewed this encounter.   Rhys Martini, PA-C 03/23/21 1012    Rhys Martini, PA-C 03/23/21 1019

## 2021-03-23 NOTE — ED Notes (Signed)
Cheree Ditto, pa notified patient not in room and did not receive injection

## 2021-03-23 NOTE — Discharge Instructions (Addendum)
-  We treated you for gonorrhea today with a shot of antibiotic called Rocephin. -Doxycycline twice daily for 7 days. This is the treatment for chlamydia.  Make sure to wear sunscreen while spending time outside while on this medication as it can increase your chance of sunburn. You can take this medication with food if you have a sensitive stomach.   -We are testing you for gonorrhea, chlamydia, trichomonas, BV, yeast.  Abstain from intercourse until negative result or until you have completed treatment.

## 2021-03-23 NOTE — Telephone Encounter (Signed)
Patient left without receiving IM Rocephin or paperwork.  Called to inform patient and she states she had an emergency and had to leave.  Will pick up prescription sent to pharmacy.  She was made aware that she may have to return for the shot at a later time depending on the results.  Patient verbalized understanding.

## 2021-03-24 ENCOUNTER — Telehealth (HOSPITAL_COMMUNITY): Payer: Self-pay | Admitting: Emergency Medicine

## 2021-03-24 LAB — CERVICOVAGINAL ANCILLARY ONLY
Bacterial Vaginitis (gardnerella): POSITIVE — AB
Candida Glabrata: NEGATIVE
Candida Vaginitis: NEGATIVE
Chlamydia: NEGATIVE
Comment: NEGATIVE
Comment: NEGATIVE
Comment: NEGATIVE
Comment: NEGATIVE
Comment: NEGATIVE
Comment: NORMAL
Neisseria Gonorrhea: NEGATIVE
Trichomonas: NEGATIVE

## 2021-03-24 MED ORDER — METRONIDAZOLE 500 MG PO TABS: 500.0000 mg | ORAL_TABLET | Freq: Two times a day (BID) | ORAL | 0 refills | Status: DC

## 2021-03-27 DIAGNOSIS — Z0000 Encounter for general adult medical examination without abnormal findings: Secondary | ICD-10-CM | POA: Diagnosis not present

## 2021-04-10 DIAGNOSIS — A64 Unspecified sexually transmitted disease: Secondary | ICD-10-CM | POA: Diagnosis not present

## 2021-06-17 ENCOUNTER — Encounter (HOSPITAL_COMMUNITY): Payer: Self-pay

## 2021-06-17 ENCOUNTER — Other Ambulatory Visit: Payer: Self-pay

## 2021-06-17 ENCOUNTER — Ambulatory Visit (HOSPITAL_COMMUNITY)
Admission: EM | Admit: 2021-06-17 | Discharge: 2021-06-17 | Disposition: A | Discharge status: Home / Self Care | Payer: Medicaid Other | Attending: Emergency Medicine | Admitting: Emergency Medicine

## 2021-06-17 DIAGNOSIS — Z3202 Encounter for pregnancy test, result negative: Secondary | ICD-10-CM | POA: Diagnosis not present

## 2021-06-17 DIAGNOSIS — Z113 Encounter for screening for infections with a predominantly sexual mode of transmission: Secondary | ICD-10-CM | POA: Insufficient documentation

## 2021-06-17 LAB — POC URINE PREG, ED: Preg Test, Ur: NEGATIVE

## 2021-06-17 LAB — HIV ANTIBODY (ROUTINE TESTING W REFLEX): HIV Screen 4th Generation wRfx: NONREACTIVE

## 2021-06-17 NOTE — Discharge Instructions (Signed)
We will contact you if the results from your lab work are positive and require additional treatment.    Do not have sex while taking undergoing treatment for STI.  Make sure that all of your partners get tested and treated.   Use a condom or other barrier method for all sexual encounters.    Return or go to the Emergency Department if symptoms worsen or do not improve in the next few days.  

## 2021-06-17 NOTE — ED Provider Notes (Signed)
[facility]    CSN: [id number] Arrival date & time: 06/17/21  1048      History   Chief Complaint Chief Complaint  Patient presents with   STD Testing    HPI Wendy Adams is a 20 y.o. female.   Patient here requesting STD screening.  Denies any symptoms including vaginal discharge or pain.  Denies any known contacts with STI positive partners.  Denies any dysuria, urgency, or frequency.  Denies any birth control use.  Denies any trauma, injury, or other precipitating event.  Denies any specific alleviating or aggravating factors.  Denies any fevers, chest pain, shortness of breath, N/V/D, numbness, tingling, weakness, abdominal pain, or headaches.    The history is provided by the patient.   Past Medical History:  Diagnosis Date   Allergy    Angio-edema    Asthma    Eczema    Headache(784.0)    Multiple allergies     Patient Active Problem List   Diagnosis Date Noted   Mild intermittent asthma without complication 08/24/2019   Seasonal and perennial allergic rhinitis 08/24/2019   Asthma, well controlled 04/07/2018   Seasonal allergies 04/07/2018   Menstrual cramps 04/07/2018   Hyperthyroidism 02/26/2018   Abnormal thyroid blood test 01/25/2018   Goiter 01/25/2018   Unintentional weight loss 01/25/2018   Normal vaginal delivery 03/08/2017   Perennial and seasonal allergic rhinoconjunctivitis 12/02/2015   Moderate persistent asthma 12/07/2013   Other atopic dermatitis 12/07/2013   Allergy with anaphylaxis due to food, 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 Authorizing Provider  EPINEPHrine 0.3  mg/0.3 mL IJ SOAJ injection Inject 0.3 mLs (0.3 mg total) into the muscle as needed for anaphylaxis. 08/24/19   Ellamae Sia, DO  metroNIDAZOLE (FLAGYL) 500 MG tablet Take 1 tablet (500 mg total) by mouth 2 (two) times daily. 03/24/21   LampteyBritta Mccreedy, MD    Family History Family History  Problem Relation Age of Onset   Asthma Father    Eczema Father    Diabetes Maternal Grandmother    Lupus Maternal Grandmother    Diabetes Maternal Grandfather    Migraines Mother    Alcohol abuse Neg Hx    Arthritis Neg Hx    Birth defects Neg Hx    Cancer Neg Hx    COPD Neg Hx    Depression Neg Hx    Drug abuse Neg Hx    Early death Neg Hx    Hearing loss Neg Hx    Heart disease Neg Hx    Hyperlipidemia Neg Hx    Hypertension Neg Hx    Kidney disease Neg Hx    Learning disabilities Neg Hx    Mental illness Neg Hx    Mental retardation Neg Hx    Vision loss Neg Hx    Allergic rhinitis  Neg Hx    Angioedema Neg Hx    Atopy Neg Hx    Immunodeficiency Neg Hx    Urticaria Neg Hx    Cystic fibrosis Neg Hx    Emphysema Neg Hx     Social History Social History   Tobacco Use   Smoking status: Never   Smokeless tobacco: Never  Vaping Use   Vaping Use: Never used  Substance Use Topics   Alcohol use: No   Drug use: No     Allergies   Food, Peanut oil, Beef-derived products, Chicken protein, Eggs or egg-derived products, Pork-derived products, and Shellfish allergy   Review of Systems Review of Systems  All other systems reviewed and are negative.   Physical Exam Triage Vital Signs ED Triage Vitals  Enc Vitals Group     BP 06/17/21 1241 122/80     Pulse Rate 06/17/21 1241 77     Resp 06/17/21 1241 18     Temp 06/17/21 1241 98 F (36.7 C)     Temp Source 06/17/21 1241 Oral     SpO2 06/17/21 1241 96 %     Weight --      Height --      Head Circumference --      Peak Flow --      Pain Score 06/17/21 1244 0     Pain Loc --      Pain Edu? --      Excl. in GC? --    No  data found.  Updated Vital Signs BP 122/80 (BP Location: Left Arm)   Pulse 77   Temp 98 F (36.7 C) (Oral)   Resp 18   SpO2 96%   Visual Acuity Right Eye Distance:   Left Eye Distance:   Bilateral Distance:    Right Eye Near:   Left Eye Near:    Bilateral Near:     Physical Exam Vitals and nursing note reviewed.  Constitutional:      General: She is not in acute distress.    Appearance: Normal appearance. She is not ill-appearing, toxic-appearing or diaphoretic.  HENT:     Head: Normocephalic and atraumatic.  Eyes:     Conjunctiva/sclera: Conjunctivae normal.  Cardiovascular:     Rate and Rhythm: Normal rate.     Pulses: Normal pulses.  Pulmonary:     Effort: Pulmonary effort is normal.  Abdominal:     General: Abdomen is flat.  Genitourinary:    Comments: declines Musculoskeletal:        General: Normal range of motion.     Cervical back: Normal range of motion.  Skin:    General: Skin is warm and dry.  Neurological:     General: No focal deficit present.     Mental Status: She is alert and oriented to person, place, and time.  Psychiatric:        Mood and Affect: Mood normal.     UC Treatments / Results  Labs (all labs ordered are listed, but only abnormal results are displayed) Labs Reviewed  RPR  HIV ANTIBODY (ROUTINE TESTING W REFLEX)  POC URINE PREG, ED  CERVICOVAGINAL ANCILLARY ONLY    EKG   Radiology No results found.  Procedures Procedures (including critical care time)  Medications Ordered in UC Medications - No data to display  Initial Impression / Assessment and Plan / UC Course  I have reviewed the triage vital signs and the nursing notes.  Pertinent labs & imaging results that were  available during my care of the patient were reviewed by me and considered in my medical decision making (see chart for details).    Assessment negative for red flags or concerns.  Self swab obtained and will treat based on results.  Urine  pregnancy test negative.  Discussed safe sex practices including condoms or other barrier method use.  Recommend abstaining from sex while waiting for test results.  Follow-up as needed. Final Clinical Impressions(s) / UC Diagnoses   Final diagnoses:  Screen for STD (sexually transmitted disease)  Negative pregnancy test     Discharge Instructions      We will contact you if the results from your lab work are positive and require additional treatment.     Do not have sex while taking undergoing treatment for STI.  Make sure that all of your partners get tested and treated.   Use a condom or other barrier method for all sexual encounters.    Return or go to the Emergency Department if symptoms worsen or do not improve in the next few days.      ED Prescriptions   None    PDMP not reviewed this encounter.   Ivette Loyal, NP 06/17/21 [phone]

## 2021-06-17 NOTE — ED Triage Notes (Signed)
Pt presents for STD testing with no known symptoms.  

## 2021-06-18 LAB — RPR: RPR Ser Ql: NONREACTIVE

## 2021-06-19 LAB — CERVICOVAGINAL ANCILLARY ONLY
Bacterial Vaginitis (gardnerella): POSITIVE — AB
Candida Glabrata: NEGATIVE
Candida Vaginitis: NEGATIVE
Chlamydia: NEGATIVE
Comment: NEGATIVE
Comment: NEGATIVE
Comment: NEGATIVE
Comment: NEGATIVE
Comment: NEGATIVE
Comment: NORMAL
Neisseria Gonorrhea: NEGATIVE
Trichomonas: NEGATIVE

## 2021-06-20 ENCOUNTER — Telehealth (HOSPITAL_COMMUNITY): Payer: Self-pay | Admitting: Emergency Medicine

## 2021-06-20 DIAGNOSIS — N760 Acute vaginitis: Secondary | ICD-10-CM | POA: Diagnosis not present

## 2021-06-20 DIAGNOSIS — B9689 Other specified bacterial agents as the cause of diseases classified elsewhere: Secondary | ICD-10-CM | POA: Diagnosis not present

## 2021-06-20 DIAGNOSIS — Z124 Encounter for screening for malignant neoplasm of cervix: Secondary | ICD-10-CM | POA: Diagnosis not present

## 2021-06-20 MED ORDER — METRONIDAZOLE 500 MG PO TABS: 500.0000 mg | ORAL_TABLET | Freq: Two times a day (BID) | ORAL | 0 refills | Status: DC

## 2021-06-22 ENCOUNTER — Other Ambulatory Visit: Payer: Self-pay

## 2021-06-22 ENCOUNTER — Ambulatory Visit (HOSPITAL_COMMUNITY)
Admission: EM | Admit: 2021-06-22 | Discharge: 2021-06-22 | Discharge status: Against Medical Advice | Payer: Medicaid Other

## 2021-06-26 DIAGNOSIS — Z113 Encounter for screening for infections with a predominantly sexual mode of transmission: Secondary | ICD-10-CM | POA: Diagnosis not present

## 2021-06-26 DIAGNOSIS — Z0000 Encounter for general adult medical examination without abnormal findings: Secondary | ICD-10-CM | POA: Diagnosis not present

## 2021-06-29 DIAGNOSIS — R87612 Low grade squamous intraepithelial lesion on cytologic smear of cervix (LGSIL): Secondary | ICD-10-CM | POA: Insufficient documentation

## 2021-09-07 ENCOUNTER — Other Ambulatory Visit: Payer: Self-pay

## 2021-09-07 ENCOUNTER — Ambulatory Visit (HOSPITAL_COMMUNITY)
Admission: EM | Admit: 2021-09-07 | Discharge: 2021-09-07 | Disposition: A | Discharge status: Home / Self Care | Payer: Medicaid Other | Attending: Student | Admitting: Student

## 2021-09-07 NOTE — ED Notes (Signed)
Pt did not answer in lobby.

## 2021-09-07 NOTE — ED Triage Notes (Signed)
PT called and no response.

## 2021-10-09 ENCOUNTER — Other Ambulatory Visit: Payer: Self-pay

## 2021-10-09 ENCOUNTER — Ambulatory Visit (HOSPITAL_COMMUNITY)
Admission: EM | Admit: 2021-10-09 | Discharge: 2021-10-09 | Discharge status: Absent without leave | Payer: Medicaid Other

## 2021-11-30 DIAGNOSIS — Z113 Encounter for screening for infections with a predominantly sexual mode of transmission: Secondary | ICD-10-CM | POA: Diagnosis not present

## 2021-12-17 ENCOUNTER — Other Ambulatory Visit: Payer: Self-pay

## 2021-12-17 ENCOUNTER — Emergency Department (HOSPITAL_COMMUNITY)
Admission: EM | Admit: 2021-12-17 | Discharge: 2021-12-17 | Disposition: A | Discharge status: Home / Self Care | Payer: Medicaid Other | Attending: Emergency Medicine | Admitting: Emergency Medicine

## 2021-12-17 ENCOUNTER — Inpatient Hospital Stay (EMERGENCY_DEPARTMENT_HOSPITAL)
Admission: AD | Admit: 2021-12-17 | Discharge: 2021-12-17 | Disposition: A | Discharge status: Home / Self Care | Payer: Medicaid Other | Source: Home / Self Care | Attending: Obstetrics and Gynecology | Admitting: Obstetrics and Gynecology

## 2021-12-17 ENCOUNTER — Encounter (HOSPITAL_COMMUNITY): Payer: Self-pay

## 2021-12-17 DIAGNOSIS — Z91010 Allergy to peanuts: Secondary | ICD-10-CM | POA: Diagnosis not present

## 2021-12-17 DIAGNOSIS — R1030 Lower abdominal pain, unspecified: Secondary | ICD-10-CM | POA: Insufficient documentation

## 2021-12-17 DIAGNOSIS — N912 Amenorrhea, unspecified: Secondary | ICD-10-CM | POA: Insufficient documentation

## 2021-12-17 DIAGNOSIS — N926 Irregular menstruation, unspecified: Secondary | ICD-10-CM

## 2021-12-17 DIAGNOSIS — Z3202 Encounter for pregnancy test, result negative: Secondary | ICD-10-CM | POA: Diagnosis not present

## 2021-12-17 DIAGNOSIS — Z3200 Encounter for pregnancy test, result unknown: Secondary | ICD-10-CM | POA: Diagnosis present

## 2021-12-17 LAB — I-STAT BETA HCG BLOOD, ED (MC, WL, AP ONLY): I-stat hCG, quantitative: <5 m[IU]/mL (ref <5)

## 2021-12-17 NOTE — ED Provider Notes (Signed)
?  [facility] EMERGENCY DEPARTMENT ?Provider Note ? ? ?CSN: [id number] ?Arrival date & time: 12/17/21  0859 ? ?  ? ?History ? ?Chief Complaint  ?Patient presents with  ? Possible Pregnancy  ? ? ?Wendy Adams is a 21 y.o. female presenting for pregnancy confirmation.  Reports her last menstrual period was 2/22.  Had a faint pregnancy test at home yesterday.  [facility]ent to [facility] and they told her they would not perform a pregnancy test.  Denies any vaginal symptoms but says that she has had some lower abdominal cramping. ? ? ?Possible Pregnancy ? ? ?  ? ?Home Medications ?Prior to Admission medications   ?Medication Sig Start Date End Date Taking? Authorizing Provider  ?EPINEPHrine 0.3 mg/0.3 mL IJ SOAJ injection Inject 0.3 mLs (0.3 mg total) into the muscle as needed for anaphylaxis. 08/24/19   Ellamae Sia, DO  ?metroNIDAZOLE (FLAGYL) 500 MG tablet Take 1 tablet (500 mg total) by mouth 2 (two) times daily. 06/20/21   Lamptey, Britta Mccreedy, MD  ?   ? ?Allergies    ?Food, Peanut oil, Beef-derived products, Chicken protein, Eggs or egg-derived products, Pork-derived products, and Shellfish allergy   ? ?Review of Systems   ?Review of Systems ? ?Physical Exam ?Updated Vital Signs ?BP 125/84   Pulse 96   Temp 98 ?F (36.7 ?C)   Resp 16   LMP 11/08/2021   SpO2 100%  ?Physical Exam ?Vitals and nursing note reviewed.  ?Constitutional:   ?   Appearance: Normal appearance.  ?HENT:  ?   Head: Normocephalic and atraumatic.  ?Eyes:  ?   General: No scleral icterus. ?   Conjunctiva/sclera: Conjunctivae normal.  ?Pulmonary:  ?   Effort: Pulmonary effort is normal. No respiratory distress.  ?Skin: ?   Findings: No rash.  ?Neurological:  ?   Mental Status: She is alert.  ?Psychiatric:     ?   Mood and Affect: Mood normal.  ? ? ?ED Results / Procedures / Treatments   ?Labs ?(all labs ordered are listed, but only abnormal results are displayed) ?Labs Reviewed  ?I-STAT BETA HCG BLOOD, ED ([facility], [facility], [facility] ONLY)   ? ? ?EKG ?None ? ?Radiology ?No results found. ? ?Procedures ?Procedures  ? ?Medications Ordered in ED ?Medications - No data to display ? ?ED Course/ Medical Decision Making/ A&P ?  ?                        ?Medical Decision Making ? ?I stat hCG negative.  She reports that she has an OB/GYN and she will follow-up with them. ? ?Final Clinical Impression(s) / ED Diagnoses ?Final diagnoses:  ?Negative pregnancy test  ? ? ?Rx / DC Orders ? ? ?Results and diagnoses were explained to the patient. Return precautions discussed in full. Patient had no additional questions and expressed complete understanding. ? ? ?This chart was dictated using voice recognition software.  Despite best efforts to proofread,  errors can occur which can change the documentation meaning.  ?  ?Saddie Benders, PA-C ?12/17/21 1031 ? ?  ?Gloris Manchester, MD ?12/18/21 0024 ? ?

## 2021-12-17 NOTE — ED Triage Notes (Addendum)
Pt states she wants pregnancy confirmation.  Reports + home preg test this morning.  LMP 11/08/21.  Denies any symptoms.   ? ?States she went to [facility] and was told they didn't do pregnancy confirmations. ?

## 2021-12-17 NOTE — ED Notes (Signed)
Discharged by PA at triage. 

## 2021-12-17 NOTE — Discharge Instructions (Signed)
Negative Pregnancy today ?

## 2021-12-17 NOTE — [facility] Provider Note (Signed)
?  S:   ?21 y.o. G1P1001 @Unknown  by LMP presents to [facility] for pregnancy confirmation.  She denies abdominal pain or vaginal bleeding today.  ? ?Reports faint positive at home.   ? ?O: ?BP 116/73 (BP Location: Right Arm)   Pulse 98   Temp 98.7 ?F (37.1 ?C) (Oral)   Resp 16   Ht 5\' 3"  (1.6 m)   Wt 59.3 kg   LMP 11/08/2021   SpO2 99% Comment: room air  BMI 23.15 kg/m?  ?Physical Examination: General appearance - alert, well appearing, and in no distress, oriented to person, place, and time and acyanotic, in no respiratory distress ? ?No results found for this or any previous visit (from the past 48 hour(s)). ? ?A: ?Missed menses ? ? ?P: ?D/C home ?Informed pt we do not do pregnancy verifications in [facility] ?Referred to [facility] for pregnancy test & verification- can go tomorrow at 0815. ?Return to [facility] as needed for pregnancy related emergencies ? ?[facility], NP ?8:58 AM  ?

## 2021-12-17 NOTE — [facility] Note (Signed)
Wendy Adams is a 21 y.o. here in [facility] reporting: is here for pregnancy confirmation. States had light positive this morning. Occasional cramping but no pain currently. Denies bleeding.  ? ?LMP: 11/08/2021 ? ?Onset of complaint: today ? ?Pain score: 0/10 ? ?Vitals:  ? 12/17/21 0848  ?BP: 116/73  ?Pulse: 98  ?Resp: 16  ?Temp: 98.7 ?F (37.1 ?C)  ?SpO2: 99%  ?   ?Lab orders placed from triage: none ? ?

## 2021-12-19 DIAGNOSIS — N898 Other specified noninflammatory disorders of vagina: Secondary | ICD-10-CM | POA: Diagnosis not present

## 2021-12-19 DIAGNOSIS — A599 Trichomoniasis, unspecified: Secondary | ICD-10-CM | POA: Diagnosis not present

## 2021-12-19 DIAGNOSIS — N926 Irregular menstruation, unspecified: Secondary | ICD-10-CM | POA: Diagnosis not present

## 2022-02-14 DIAGNOSIS — J4520 Mild intermittent asthma, uncomplicated: Secondary | ICD-10-CM | POA: Diagnosis not present

## 2022-02-14 DIAGNOSIS — J302 Other seasonal allergic rhinitis: Secondary | ICD-10-CM | POA: Diagnosis not present

## 2022-02-14 DIAGNOSIS — E0590 Thyrotoxicosis, unspecified without thyrotoxic crisis or storm: Secondary | ICD-10-CM | POA: Diagnosis not present

## 2022-03-31 NOTE — Patient Instructions (Incomplete)
Other atopic dermatitis Eczema is usually worse in the winter months and currently having a flare despite using hydrocortisone and mometasone 0.1% ointment.  No specific triggers noted. Start proper skin care.  May use triamcinolone 0.5% ointment twice a day as daily to affected areas up to 3 weeks in a row; stop when clear and can restart as needed for flares. Do not use on the face, neck, armpits or groin area. Do not use more than 3 weeks in a row.  Start using a daily antihistamine to help with the itching - May use over the counter antihistamines such as Zyrtec (cetirizine), Claritin (loratadine), Allegra (fexofenadine), or Xyzal (levocetirizine) daily.   Mild intermittent asthma without complication Asthma since a young child and currently uses albuterol as needed for exercise-induced symptoms.   May use albuterol rescue inhaler 2 puffs or nebulizer every 4 to 6 hours as needed for shortness of breath, chest tightness, coughing, and wheezing. May use albuterol rescue inhaler 2 puffs 5 to 15 minutes prior to strenuous physical activities. Monitor frequency of use.      Allergy with anaphylaxis due to food, subsequent encounter Currently avoiding shellfish, peanuts, tree nuts, peas and eggs due to reaction after ingestion in the form of whole body pruritus and rash.  No prior epinephrine use.  2017 skin testing was positive to shellfish however negative to peanuts, tree nut, eggs and peas.  Patient did not get recommended allergy blood work. Continue to avoid shellfish, tree nuts, peanuts, peas and eggs. In case of an allergic reaction, give Benadryl *** {Blank single:19197::"teaspoonful","teaspoonfuls","capsules"} every {blank single:19197::"4","6"} hours, and if life-threatening symptoms occur, inject with {Blank single:19197::"EpiPen 0.3 mg","EpiPen 0.15 mg","AuviQ 0.3 mg","AuviQ 0.15 mg","AuviQ 0.10 mg"}. Food action plan given.    Perennial and seasonal allergic rhinoconjunctivitis Mild  perennial rhinoconjunctivitis symptoms and using Flonase and over-the-counter antihistamines as needed with some benefit.  2017 skin testing was positive to dust mites, cockroach, tree, grass, ragweed, weed, mold and cat. Continue environmental control measures. May use over the counter antihistamines such as Zyrtec (cetirizine), Claritin (loratadine), Allegra (fexofenadine), or Xyzal (levocetirizine) daily as needed. May use Flonase 1 to 2 sprays per nostril once a day as needed for nasal symptoms  Please let us know if this treatment plan is not working well for you.  Schedule a follow up appointment in months

## 2022-04-02 ENCOUNTER — Encounter: Payer: Self-pay | Admitting: Family

## 2022-04-02 ENCOUNTER — Ambulatory Visit (INDEPENDENT_AMBULATORY_CARE_PROVIDER_SITE_OTHER): Payer: Medicaid Other | Admitting: Family

## 2022-04-02 VITALS — BP 116/72 | HR 76 | Resp 18 | Ht 62.0 in | Wt 129.0 lb

## 2022-04-02 DIAGNOSIS — L2089 Other atopic dermatitis: Secondary | ICD-10-CM

## 2022-04-02 DIAGNOSIS — J4520 Mild intermittent asthma, uncomplicated: Secondary | ICD-10-CM | POA: Diagnosis not present

## 2022-04-02 DIAGNOSIS — J302 Other seasonal allergic rhinitis: Secondary | ICD-10-CM

## 2022-04-02 DIAGNOSIS — J3089 Other allergic rhinitis: Secondary | ICD-10-CM | POA: Diagnosis not present

## 2022-04-02 DIAGNOSIS — T7800XD Anaphylactic reaction due to unspecified food, subsequent encounter: Secondary | ICD-10-CM | POA: Diagnosis not present

## 2022-04-02 MED ORDER — EPINEPHRINE 0.3 MG/0.3ML IJ SOAJ: 0.3000 mg | INTRAMUSCULAR | 2 refills | Status: AC | PRN

## 2022-04-02 MED ORDER — ALBUTEROL SULFATE HFA 108 (90 BASE) MCG/ACT IN AERS: INHALATION_SPRAY | RESPIRATORY_TRACT | 1 refills | Status: AC

## 2022-04-02 NOTE — Progress Notes (Deleted)
[address] [place] [address] Dept: [phone]  FOLLOW UP NOTE  Patient ID: Wendy Adams, female    DOB: 2001-07-03  Age: 21 y.o. MRN: [id number] Date of Office Visit: 04/02/2022  Assessment  Chief Complaint: Allergic Rhinitis  (No issues), Asthma (No issues ), and Eczema (No issues )  HPI Kezia L Csaszar    Drug Allergies:  Allergies  Allergen Reactions   Food Anaphylaxis, Hives and Other (See Comments)    Pt is allergic to peanut butter and [place].    Peanut Oil Anaphylaxis   Beef-Derived Products Hives   Chicken Protein Hives   Eggs Or Egg-Derived Products Hives   Pork-Derived Products Hives   Shellfish Allergy Hives    Review of Systems: ROS ***  Physical Exam: BP 116/72   Pulse 76   Resp 18   Ht 5\' 2"  (1.575 m)   Wt 129 lb (58.5 kg)   SpO2 100%   BMI 23.59 kg/m    Physical Exam  Diagnostics:    Assessment and Plan: No diagnosis found.  No orders of the defined types were placed in this encounter.   Patient Instructions  Other atopic dermatitis Eczema is usually worse in the winter months and currently having a flare despite using hydrocortisone and mometasone 0.1% ointment.  No specific triggers noted. Start proper skin care.  May use triamcinolone 0.5% ointment twice a day as daily to affected areas up to 3 weeks in a row; stop when clear and can restart as needed for flares. Do not use on the face, neck, armpits or groin area. Do not use more than 3 weeks in a row.  Start using a daily antihistamine to help with the itching - May use over the counter antihistamines such as Zyrtec (cetirizine), Claritin (loratadine), Allegra (fexofenadine), or Xyzal (levocetirizine) daily.   Mild intermittent asthma without complication Asthma since a young child and currently uses albuterol as needed for exercise-induced symptoms.   May use albuterol rescue inhaler 2 puffs or nebulizer every 4 to 6 hours as needed for shortness of breath, chest  tightness, coughing, and wheezing. May use albuterol rescue inhaler 2 puffs 5 to 15 minutes prior to strenuous physical activities. Monitor frequency of use.      Allergy with anaphylaxis due to food, subsequent encounter Currently avoiding shellfish, peanuts, tree nuts, peas and eggs due to reaction after ingestion in the form of whole body pruritus and rash.  No prior epinephrine use.  2017 skin testing was positive to shellfish however negative to peanuts, tree nut, eggs and peas.  Patient did not get recommended allergy blood work. Continue to avoid shellfish, tree nuts, peanuts, peas and eggs. In case of an allergic reaction, give Benadryl *** {Blank single:19197::"teaspoonful","teaspoonfuls","capsules"} every {blank single:19197::"4","6"} hours, and if life-threatening symptoms occur, inject with {Blank single:19197::"EpiPen 0.3 mg","EpiPen 0.15 mg","AuviQ 0.3 mg","AuviQ 0.15 mg","AuviQ 0.10 mg"}. Food action plan given.    Perennial and seasonal allergic rhinoconjunctivitis Mild perennial rhinoconjunctivitis symptoms and using Flonase and over-the-counter antihistamines as needed with some benefit.  2017 skin testing was positive to dust mites, cockroach, tree, grass, ragweed, weed, mold and cat. Continue environmental control measures. May use over the counter antihistamines such as Zyrtec (cetirizine), Claritin (loratadine), Allegra (fexofenadine), or Xyzal (levocetirizine) daily as needed. May use Flonase 1 to 2 sprays per nostril once a day as needed for nasal symptoms  Please let 2018 know if this treatment plan is not working well for you.  Schedule a follow up  appointment in months No follow-ups on file.    Thank you for the opportunity to care for this patient.  Please do not hesitate to contact me with questions.  Nehemiah Settle, FNP [facility]

## 2022-04-02 NOTE — Progress Notes (Addendum)
[address] [place] [address] Dept: [phone]  FOLLOW UP NOTE  Patient ID: Wendy Adams, female    DOB: Aug 08, 2001  Age: 21 y.o. MRN: [id number] Date of Office Visit: 04/02/2022  Assessment  Chief Complaint: Allergic Rhinitis  (No issues), Asthma (No issues ), and Eczema (No issues )  HPI Wendy Adams is a 21 year old female who presents today for follow-up of atopic dermatitis, mild intermittent asthma without complication, allergy with anaphylaxis due to food, and perennial and seasonal allergic rhinoconjunctivitis.  She was last seen on August 24, 2019 by Dr. Selena Batten.  She denies any new diagnosis or surgery since her last office visit.  Atopic dermatitis is reported as doing good with triamcinolone 0.5% ointment as needed.  In the past she has tried hydrocortisone and mometasone 0.1% ointment and they did not help with her eczema.  She uses daily cocoa butter for moisturization.  She has not had any skin infections since we last saw her.  Mild intermittent asthma is reported as controlled with albuterol as needed.  She denies any cough, wheeze, tightness in chest, shortness of breath, and nocturnal awakenings due to breathing problems.  Since her last office visit she has not required any systemic steroids or made any trips to the emergency room or urgent care due to breathing problems.  She uses her albuterol only before exercise.  Allergy with anaphylaxis due to food: She continues to avoid shellfish, tree nuts, peanuts, peas, and eggs in all forms.  She has not had any accidental ingestion or use of her epinephrine autoinjector device.  She reports that her epinephrine autoinjector device is out of date.  She does not eat egg in baked goods because this causes her throat to itch.  Perennial and seasonal allergic rhinoconjunctivitis: She reports clear rhinorrhea here and there and nasal congestion when she is around animals.  She denies postnasal drip.  She has not had  any sinus infections since we last saw her.  She takes Zyrtec 10 mg once a day and uses Flonase nasal spray as needed.  She is requesting a letter stating that she is allergic to animals.  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   Drug Allergies:  Allergies  Allergen Reactions   Food Anaphylaxis, Hives and Other (See Comments)    Pt is allergic to peanut butter and [place].    Peanut Oil Anaphylaxis   Beef-Derived Products Hives   Chicken Protein Hives   Eggs Or Egg-Derived Products Hives   Pork-Derived Products Hives   Shellfish Allergy Hives    Review of Systems: Review of Systems  Constitutional:  Negative for chills and fever.  HENT:         Reports rhinorrhea at times and nasal congestion when she is around animals.  Denies postnasal drip.  Eyes:        Denies itchy watery eyes  Respiratory:  Negative for cough, shortness of breath and wheezing.        Denies cough, wheeze, tightness in chest, shortness of breath, and nocturnal awakenings due to breathing problems  Cardiovascular:  Negative for chest pain and palpitations.  Gastrointestinal:        Denies heartburn or reflux symptoms  Genitourinary:  Negative for frequency.  Skin:  Negative for itching and rash.  Neurological:  Negative for headaches.  Endo/Heme/Allergies:  Positive for environmental allergies.    Physical Exam: BP  116/72   Pulse 76   Resp 18   Ht 5\' 2"  (1.575 m)   Wt 129 lb (58.5 kg)   SpO2 100%   BMI 23.59 kg/m    Physical Exam Constitutional:      Appearance: Normal appearance.  HENT:     Head: Normocephalic and atraumatic.     Comments: Pharynx normal, eyes normal, ears normal, nose: Bilateral lower turbinates moderately edematous and slightly erythematous with no drainage noted    Right Ear: Tympanic membrane, ear canal and external ear normal.     Left Ear: Tympanic membrane, ear canal and external ear normal.      Mouth/Throat:     Mouth: Mucous membranes are moist.     Pharynx: Oropharynx is clear.  Eyes:     Conjunctiva/sclera: Conjunctivae normal.  Cardiovascular:     Rate and Rhythm: Normal rate and regular rhythm.     Heart sounds: Normal heart sounds.  Pulmonary:     Effort: Pulmonary effort is normal.     Breath sounds: Normal breath sounds.     Comments: Lungs clear to auscultation Musculoskeletal:     Cervical back: Neck supple.  Skin:    General: Skin is warm.     Comments: No eczematous lesions noted  Neurological:     Mental Status: She is alert and oriented to person, place, and time.  Psychiatric:        Mood and Affect: Mood normal.        Behavior: Behavior normal.        Thought Content: Thought content normal.        Judgment: Judgment normal.     Diagnostics: FVC 2.96 L (97%), FEV1 2.45 L (91%).  Predicted FVC 3.05 L, predicted FEV1 2.70 L.  Spirometry indicates normal respiratory function.  Assessment and Plan: 1. Mild intermittent asthma without complication   2. Seasonal and perennial allergic rhinitis   3. Allergy with anaphylaxis due to food, subsequent encounter   4. Other atopic dermatitis     Meds ordered this encounter  Medications   EPINEPHrine 0.3 mg/0.3 mL IJ SOAJ injection    Sig: Inject 0.3 mg into the muscle as needed for anaphylaxis.    Dispense:  1 each    Refill:  2   albuterol (VENTOLIN HFA) 108 (90 Base) MCG/ACT inhaler    Sig: Inhale 2 puffs every 4-6 hours as needed for cough, wheeze, tightness in chest, or shortness of breath.    Dispense:  18 g    Refill:  1    Patient Instructions  Other atopic dermatitis Eczema is usually worse in the winter months and  hydrocortisone and mometasone 0.1% ointment did not help  No specific triggers noted. Continue proper skin care.  May use triamcinolone 0.5% ointment twice a day as daily to affected areas up to 3 weeks in a row; stop when clear and can restart as needed for flares. Do not use on  the face, neck, armpits or groin area. Do not use more than 3 weeks in a row.  Start using a daily antihistamine to help with the itching - May use over the counter antihistamines such as Zyrtec (cetirizine), Claritin (loratadine), Allegra (fexofenadine), or Xyzal (levocetirizine) daily.   Mild intermittent asthma without complication Asthma since a young child and currently uses albuterol as needed for exercise-induced symptoms.   May use albuterol rescue inhaler 2 puffs or nebulizer every 4 to 6 hours as needed for shortness of breath, chest tightness,  coughing, and wheezing. May use albuterol rescue inhaler 2 puffs 5 to 15 minutes prior to strenuous physical activities. Monitor frequency of use.      Allergy with anaphylaxis due to food, subsequent encounter Currently avoiding shellfish, peanuts, tree nuts, peas and eggs due to reaction after ingestion in the form of whole body pruritus and rash.  No prior epinephrine use.  2017 skin testing was positive to shellfish however negative to peanuts, tree nut, eggs and peas.  Patient did not get recommended allergy blood work. Continue to avoid shellfish, tree nuts, peanuts, peas and eggs. In case of an allergic reaction, give Benadryl 4 teaspoonfuls every 6 hours, and if life-threatening symptoms occur, inject with EpiPen 0.3 mg. Food action plan given.  We will get lab work to follow up on your food allergies.We will call you with results once they are all back   Perennial and seasonal allergic rhinoconjunctivitis Mild perennial rhinoconjunctivitis symptoms and using Flonase and over-the-counter antihistamines as needed with some benefit.  2017 skin testing was positive to dust mites, cockroach, tree, grass, ragweed, weed, mold and cat. Continue environmental control measures. May use over the counter antihistamines such as Zyrtec (cetirizine), Claritin (loratadine), Allegra (fexofenadine), or Xyzal (levocetirizine) daily as needed. May use Flonase  1 to 2 sprays per nostril once a day as needed for nasal symptoms Letter given for work stating that she is allergic to cats  Please let us know if this treatment plan is not working well for you.  Schedule a follow up appointment in 6-12 months or sooner Return in about 6 months (around 10/03/2022), or if symptoms worsen or fail to improve.    Thank you for the opportunity to care for this patient.  Please do not hesitate to contact me with questions.  Nehemiah Settle, FNP [facility]

## 2022-04-06 LAB — ALLERGEN PROFILE, SHELLFISH
Clam IgE: 6.25 kU/L — AB
F023-IgE Crab: 19.7 kU/L — AB
F080-IgE Lobster: 23.4 kU/L — AB
F290-IgE Oyster: 2.93 kU/L — AB
Scallop IgE: 7.85 kU/L — AB
Shrimp IgE: 23.1 kU/L — AB

## 2022-04-06 LAB — IGE NUT PROF. W/COMPONENT RFLX
F017-IgE Hazelnut (Filbert): 0.13 kU/L — AB
F018-IgE Brazil Nut: <0.1 kU/L
F020-IgE Almond: <0.1 kU/L
F202-IgE Cashew Nut: <0.1 kU/L
F203-IgE Pistachio Nut: <0.1 kU/L
F256-IgE Walnut: <0.1 kU/L
Macadamia Nut, IgE: <0.1 kU/L
Peanut, IgE: <0.1 kU/L
Pecan Nut IgE: <0.1 kU/L

## 2022-04-06 LAB — PANEL 604726
Cor A 1 IgE: <0.1 kU/L
Cor A 14 IgE: <0.1 kU/L
Cor A 8 IgE: <0.1 kU/L
Cor A 9 IgE: <0.1 kU/L

## 2022-04-06 LAB — ALLERGEN PEA F12: Allergen Green Pea IgE: 2.53 kU/L — AB

## 2022-04-06 LAB — EGG COMPONENT PANEL
F232-IgE Ovalbumin: <0.1 kU/L
F233-IgE Ovomucoid: <0.1 kU/L

## 2022-04-06 LAB — ALLERGEN COMPONENT COMMENTS

## 2022-04-06 LAB — ALLERGEN EGG WHITE F1: Egg White IgE: 0.52 kU/L — AB

## 2022-04-06 NOTE — Progress Notes (Signed)
Please let Macrina know that we received her lab results.   Shellfish: is still very elevated. Continue to avoid and carry your epinephrine auto injector device with you at all times.  Green pea:is elevated. Continue to avoid green peas.   Egg and components: the components were negative. If interested we do an in office oral food challenge to baked egg. Please mail the recipe if interested. She would need to be off all antihistamines 3 days prior to this appointment. This appointment will last 2-3 hours. Egg was low. If interested we could do an in office oral food challenge to egg  in the future after doing the baked egg challenge. Continue to avoid egg in all forms for now.   Peanut is negative. If interested we can do an in office oral food challenge to peanut butter. She would need to bring peanut butter with her. If not interested she can continue to avoid peanuts.She would need to be off all antihistamines 3 days prior to this appointment. This appointment will last 2-3 hours. She will also need to be in good health the day of the challenge.  Tree nuts were all negative except hazelnut is low. The components for hazelnut were also negative. If interested we can do an in office oral food challenge to mixed nut butter. If not interested she can continue to avoid all tree nuts. She would need to be off all antihistamines 3 days prior to this appointment. This appointment will last 2-3 hours. She will also need to be in good health the day of the challenge.  She will need skin testing to these foods before starting the challenges

## 2022-04-06 NOTE — Addendum Note (Signed)
Addended by: Shelby Dubin on: 04/06/2022 10:27 AM   Modules accepted: Orders

## 2022-05-01 NOTE — Telephone Encounter (Signed)
LVM for patient to call the office back about lab results. Mychart message has not been seen by patient yet.

## 2022-05-02 ENCOUNTER — Encounter (HOSPITAL_COMMUNITY): Payer: Self-pay

## 2022-05-02 ENCOUNTER — Ambulatory Visit (HOSPITAL_COMMUNITY)
Admission: RE | Admit: 2022-05-02 | Discharge: 2022-05-02 | Disposition: A | Discharge status: Home / Self Care | Payer: Medicaid Other | Source: Ambulatory Visit | Attending: Internal Medicine | Admitting: Internal Medicine

## 2022-05-02 VITALS — BP 117/76 | HR 97 | Temp 99.1°F | Resp 14

## 2022-05-02 DIAGNOSIS — J029 Acute pharyngitis, unspecified: Secondary | ICD-10-CM | POA: Insufficient documentation

## 2022-05-02 DIAGNOSIS — J020 Streptococcal pharyngitis: Secondary | ICD-10-CM | POA: Insufficient documentation

## 2022-05-02 LAB — POCT RAPID STREP A, ED / UC: Streptococcus, Group A Screen (Direct): POSITIVE — AB

## 2022-05-02 MED ORDER — AMOXICILLIN 500 MG PO CAPS: 500.0000 mg | ORAL_CAPSULE | Freq: Two times a day (BID) | ORAL | 0 refills | Status: DC

## 2022-05-02 MED ORDER — IBUPROFEN 800 MG PO TABS
800.0000 mg | ORAL_TABLET | Freq: Once | ORAL | Status: AC
  Administered 2022-05-02: 800 mg via ORAL

## 2022-05-02 MED ORDER — IBUPROFEN 800 MG PO TABS
ORAL_TABLET | ORAL | Status: AC
  Filled 2022-05-02: qty 1

## 2022-05-02 NOTE — ED Provider Notes (Signed)
[facility]    CSN: [id number] Arrival date & time: 05/02/22  1127      History   Chief Complaint Chief Complaint  Patient presents with   Sore Throat   APPT 1130    HPI Wendy Adams is a 21 y.o. female.   Patient presents today with a several hour history of severe sore throat.  She reports that it woke her up at 3 AM this morning.  Pain is rated 8 on a 0-10 pain scale, localized to posterior oropharynx, described as sharp, worse with swallowing, no alleviating factors identified.  She denies any fever, nausea, vomiting, cough, congestion.  She has tried gargling with warm salt water without improvement of symptoms.  She does have a history of strep and states current symptoms are similar to previous episodes of this condition.  Denies any recent antibiotic use.  She is confident that she is not pregnant.  She has not taken any over-the-counter analgesics for pain relief.    Past Medical History:  Diagnosis Date   Allergy    Angio-edema    Asthma    Eczema    Headache(784.0)    Multiple allergies     Patient Active Problem List   Diagnosis Date Noted   LGSIL on Pap smear of cervix 06/29/2021   Mild intermittent asthma without complication 08/24/2019   Seasonal and perennial allergic rhinitis 08/24/2019   Asthma, well controlled 04/07/2018   Seasonal allergies 04/07/2018   Menstrual cramps 04/07/2018   Hyperthyroidism 02/26/2018   Abnormal thyroid blood test 01/25/2018   Goiter 01/25/2018   Unintentional weight loss 01/25/2018   Normal vaginal delivery 03/08/2017   Perennial and seasonal allergic rhinoconjunctivitis 12/02/2015   Moderate persistent asthma 12/07/2013   Other atopic dermatitis 12/07/2013   Allergy with anaphylaxis due to food, subsequent encounter 12/07/2013    Past Surgical History:  Procedure Laterality Date   NO PAST SURGERIES      OB History     Gravida  1   Para  1   Term  1   Preterm      AB      Living  1       SAB      IAB      Ectopic      Multiple  0   Live Births  1            Home Medications    Prior to Admission medications   Medication Sig Start Date End Date Taking? Authorizing Provider  amoxicillin (AMOXIL) 500 MG capsule Take 1 capsule (500 mg total) by mouth 2 (two) times daily. 05/02/22  Yes Ruchi Stoney K, PA-C  Cetirizine HCl 10 MG CAPS Take by mouth. 02/14/22  Yes [provider]  montelukast (SINGULAIR) 10 MG tablet Take by mouth. 02/14/22  Yes [provider]  albuterol (VENTOLIN HFA) 108 (90 Base) MCG/ACT inhaler Inhale 2 puffs every 4-6 hours as needed for cough, wheeze, tightness in chest, or shortness of breath. 04/02/22   Nehemiah Settle, FNP  EPINEPHrine 0.3 mg/0.3 mL IJ SOAJ injection Inject 0.3 mg into the muscle as needed for anaphylaxis. 04/02/22   Nehemiah Settle, FNP  fluticasone (FLONASE) 50 MCG/ACT nasal spray Place into the nose. 07/06/20   [provider]    Family History Family History  Problem Relation Age of Onset   Asthma Father    Eczema Father    Diabetes Maternal Grandmother    Lupus Maternal Grandmother  Diabetes Maternal Grandfather    Migraines Mother    Alcohol abuse Neg Hx    Arthritis Neg Hx    Birth defects Neg Hx    Cancer Neg Hx    COPD Neg Hx    Depression Neg Hx    Drug abuse Neg Hx    Early death Neg Hx    Hearing loss Neg Hx    Heart disease Neg Hx    Hyperlipidemia Neg Hx    Hypertension Neg Hx    Kidney disease Neg Hx    Learning disabilities Neg Hx    Mental illness Neg Hx    Mental retardation Neg Hx    Vision loss Neg Hx    Allergic rhinitis Neg Hx    Angioedema Neg Hx    Atopy Neg Hx    Immunodeficiency Neg Hx    Urticaria Neg Hx    Cystic fibrosis Neg Hx    Emphysema Neg Hx     Social History Social History   Tobacco Use   Smoking status: Never   Smokeless tobacco: Never  Vaping Use   Vaping Use: Never used  Substance Use Topics   Alcohol use: No   Drug use: No      Allergies   Food, Peanut oil, Beef-derived products, Chicken protein, Eggs or egg-derived products, Pork-derived products, and Shellfish allergy   Review of Systems Review of Systems  Constitutional:  Positive for activity change. Negative for appetite change, fatigue and fever.  HENT:  Positive for sore throat and trouble swallowing. Negative for congestion, sinus pressure, sneezing and voice change.   Respiratory:  Negative for cough and shortness of breath.   Cardiovascular:  Negative for chest pain.  Gastrointestinal:  Negative for abdominal pain, diarrhea, nausea and vomiting.  Neurological:  Negative for dizziness, light-headedness and headaches.     Physical Exam Triage Vital Signs ED Triage Vitals  Enc Vitals Group     BP 05/02/22 1136 117/76     Pulse Rate 05/02/22 1136 97     Resp 05/02/22 1136 14     Temp 05/02/22 1136 99.1 F (37.3 C)     Temp Source 05/02/22 1136 Oral     SpO2 05/02/22 1136 100 %     Weight --      Height --      Head Circumference --      Peak Flow --      Pain Score 05/02/22 1139 8     Pain Loc --      Pain Edu? --      Excl. in GC? --    No data found.  Updated Vital Signs BP 117/76 (BP Location: Right Arm)   Pulse 97   Temp 99.1 F (37.3 C) (Oral)   Resp 14   LMP 04/14/2022 (Exact Date)   SpO2 100%   Visual Acuity Right Eye Distance:   Left Eye Distance:   Bilateral Distance:    Right Eye Near:   Left Eye Near:    Bilateral Near:     Physical Exam Vitals reviewed.  Constitutional:      General: She is awake. She is not in acute distress.    Appearance: Normal appearance. She is well-developed. She is not ill-appearing.     Comments: Very pleasant female appears stated age in no acute distress sitting comfortably in exam room  HENT:     Head: Normocephalic and atraumatic.     Right Ear: Tympanic membrane, ear canal and  external ear normal. Tympanic membrane is not erythematous or bulging.     Left Ear: Tympanic  membrane, ear canal and external ear normal. Tympanic membrane is not erythematous or bulging.     Nose:     Right Sinus: No maxillary sinus tenderness or frontal sinus tenderness.     Left Sinus: No maxillary sinus tenderness or frontal sinus tenderness.     Mouth/Throat:     Pharynx: Uvula midline. No oropharyngeal exudate or posterior oropharyngeal erythema.     Tonsils: Tonsillar exudate present. No tonsillar abscesses. 1+ on the right. 1+ on the left.  Cardiovascular:     Rate and Rhythm: Normal rate and regular rhythm.     Heart sounds: Normal heart sounds, S1 normal and S2 normal. No murmur heard. Pulmonary:     Effort: Pulmonary effort is normal.     Breath sounds: Normal breath sounds. No wheezing, rhonchi or rales.     Comments: Clear to auscultation bilaterally Lymphadenopathy:     Head:     Right side of head: No submental, submandibular or tonsillar adenopathy.     Left side of head: No submental, submandibular or tonsillar adenopathy.     Cervical: No cervical adenopathy.  Psychiatric:        Behavior: Behavior is cooperative.      UC Treatments / Results  Labs (all labs ordered are listed, but only abnormal results are displayed) Labs Reviewed  POCT RAPID STREP A, ED / UC - Abnormal; Notable for the following components:      Result Value   Streptococcus, Group A Screen (Direct) POSITIVE (*)    All other components within normal limits    EKG   Radiology No results found.  Procedures Procedures (including critical care time)  Medications Ordered in UC Medications  ibuprofen (ADVIL) tablet 800 mg (800 mg Oral Given 05/02/22 1201)    Initial Impression / Assessment and Plan / UC Course  I have reviewed the triage vital signs and the nursing notes.  Pertinent labs & imaging results that were available during my care of the patient were reviewed by me and considered in my medical decision making (see chart for details).     Patient tested positive for  strep pharyngitis.  She was started on amoxicillin 500 mg twice daily for 10 days.  Discussed that she can continue gargling with warm salt water and using Tylenol and ibuprofen for pain.  She was given a dose of ibuprofen 800 mg in clinic today for pain relief.  Discussed that she should dispose of her toothbrush a few days after starting her medication to prevent reinfection.  She is contagious for 24 hours after starting antibiotics and was provided work excuse note.  Discussed that if she has any fever, difficulty swallowing, swelling of her throat, muffled voice, shortness of breath she needs to go to the emergency room immediately.  Final Clinical Impressions(s) / UC Diagnoses   Final diagnoses:  Strep pharyngitis  Sore throat     Discharge Instructions      You tested positive for strep.  Take amoxicillin 500 mg twice daily for 10 days.  Throw away your toothbrush a few days after starting medication to prevent reinfection.  Gargle with warm salt water and use Tylenol/ibuprofen for pain relief.  We gave you a dose of ibuprofen in clinic today so please do not take any additional NSAIDs (aspirin, ibuprofen/Advil, naproxen/Aleve) for 8 hours.  If you have any worsening symptoms including swelling of  your throat, high fever, nausea/vomiting interfere with oral intake, difficulty swallowing, difficulty speaking, shortness of breath you need to go to the emergency room.     ED Prescriptions     Medication Sig Dispense Auth. Provider   amoxicillin (AMOXIL) 500 MG capsule Take 1 capsule (500 mg total) by mouth 2 (two) times daily. 20 capsule Makiya Jeune, Noberto Retort, PA-C      PDMP not reviewed this encounter.   Jeani Hawking, PA-C 05/02/22 1209

## 2022-05-02 NOTE — Discharge Instructions (Addendum)
You tested positive for strep.  Take amoxicillin 500 mg twice daily for 10 days.  Throw away your toothbrush a few days after starting medication to prevent reinfection.  Gargle with warm salt water and use Tylenol/ibuprofen for pain relief.  We gave you a dose of ibuprofen in clinic today so please do not take any additional NSAIDs (aspirin, ibuprofen/Advil, naproxen/Aleve) for 8 hours.  If you have any worsening symptoms including swelling of your throat, high fever, nausea/vomiting interfere with oral intake, difficulty swallowing, difficulty speaking, shortness of breath you need to go to the emergency room. 

## 2022-05-02 NOTE — ED Triage Notes (Signed)
Patient c/o sore throat that started this morning at 0300.   Patient denies fever. Patient denies any other cold symptoms.   Patient endorses painful/difficulty swallowing.   Patient tried a salt water gargle with no relief of symptoms.

## 2022-05-13 ENCOUNTER — Emergency Department (HOSPITAL_COMMUNITY)
Admission: EM | Admit: 2022-05-13 | Discharge: 2022-05-13 | Discharge status: Against Medical Advice | Payer: Medicaid Other

## 2022-05-13 ENCOUNTER — Other Ambulatory Visit: Payer: Self-pay

## 2022-05-13 ENCOUNTER — Inpatient Hospital Stay (HOSPITAL_COMMUNITY)
Admission: AD | Admit: 2022-05-13 | Discharge: 2022-05-13 | Disposition: A | Discharge status: Home / Self Care | Payer: Medicaid Other | Attending: Obstetrics & Gynecology | Admitting: Obstetrics & Gynecology

## 2022-05-13 DIAGNOSIS — Z3200 Encounter for pregnancy test, result unknown: Secondary | ICD-10-CM

## 2022-05-13 DIAGNOSIS — O219 Vomiting of pregnancy, unspecified: Secondary | ICD-10-CM | POA: Diagnosis not present

## 2022-05-13 DIAGNOSIS — Z3201 Encounter for pregnancy test, result positive: Secondary | ICD-10-CM | POA: Diagnosis not present

## 2022-05-13 DIAGNOSIS — R112 Nausea with vomiting, unspecified: Secondary | ICD-10-CM | POA: Diagnosis not present

## 2022-05-13 DIAGNOSIS — Z0000 Encounter for general adult medical examination without abnormal findings: Secondary | ICD-10-CM | POA: Diagnosis present

## 2022-05-13 DIAGNOSIS — Z3A08 8 weeks gestation of pregnancy: Secondary | ICD-10-CM | POA: Diagnosis not present

## 2022-05-13 NOTE — ED Notes (Signed)
Called for triage, no answer

## 2022-05-13 NOTE — ED Notes (Signed)
Called for triage no answer  

## 2022-05-13 NOTE — MAU Note (Signed)
Pt called, not in lobby. Per registration patient walked back out towards main entrance with her child.

## 2022-05-13 NOTE — MAU Note (Signed)
Wendy Adams is a 21 y.o. here in MAU reporting: had 10 positive UPT at home. Is here for confirmation. No pain. Had bleeding 2 days ago but states it has completely stopped.   LMP: 03/17/2022  Onset of complaint: yesterday  Pain score: 0/10  Vitals:   05/13/22 1107  BP: 126/79  Pulse: 97  Resp: 16  Temp: 98.3 F (36.8 C)  SpO2: 96%     Lab orders placed from triage: none

## 2022-05-13 NOTE — MAU Provider Note (Signed)
Event Date/Time   First Provider Initiated Contact with Patient 05/13/22 1110      S Ms. Wendy Adams is a 21 y.o. G1P1001 patient who presents to MAU today with complaint of none. Patient wishes to confirm her pregnancy today and get an ultrasound. Patient states she has had 10 positive pregnancy tests at home. Patient denies pain or bleeding and has no concerns.  O BP 126/79 (BP Location: Right Arm)   Pulse 97   Temp 98.3 F (36.8 C) (Oral)   Resp 16   Ht 5\' 3"  (1.6 m)   Wt 59 kg   LMP 03/17/2022 (Exact Date)   SpO2 96% Comment: room air  BMI 23.05 kg/m   Patient Vitals for the past 24 hrs:  BP Temp Temp src Pulse Resp SpO2 Height Weight  05/13/22 1107 126/79 98.3 F (36.8 C) Oral 97 16 96 % -- --  05/13/22 1104 -- -- -- -- -- -- 5\' 3"  (1.6 m) 59 kg   Physical Exam Vitals and nursing note reviewed.  Constitutional:      Appearance: Normal appearance.  HENT:     Head: Normocephalic and atraumatic.  Pulmonary:     Effort: Pulmonary effort is normal.  Neurological:     Mental Status: She is alert and oriented to person, place, and time.  Psychiatric:        Mood and Affect: Mood normal.        Behavior: Behavior normal.        Thought Content: Thought content normal.        Judgment: Judgment normal.    A Medical screening exam complete Home pregnancy test positive  P Discharge from MAU in stable condition OB list given Safe meds in pregnancy list given Discussed does not need confirmatory test, but option given to walk in to [facility] for pregnancy test if desired Warning signs for worsening condition that would warrant emergency follow-up discussed Patient may return to MAU as needed   Tabbatha Bordelon, , NP 05/13/2022 11:17 AM

## 2022-05-14 ENCOUNTER — Ambulatory Visit (INDEPENDENT_AMBULATORY_CARE_PROVIDER_SITE_OTHER): Payer: Medicaid Other | Admitting: Emergency Medicine

## 2022-05-14 VITALS — BP 105/67 | HR 81 | Ht 63.0 in | Wt 130.0 lb

## 2022-05-14 DIAGNOSIS — Z3201 Encounter for pregnancy test, result positive: Secondary | ICD-10-CM

## 2022-05-14 DIAGNOSIS — Z3200 Encounter for pregnancy test, result unknown: Secondary | ICD-10-CM

## 2022-05-14 LAB — POCT URINE PREGNANCY: Preg Test, Ur: POSITIVE — AB

## 2022-05-14 NOTE — Progress Notes (Signed)
Wendy Adams presents today for UPT. She has no unusual complaints and complains of nausea with vomiting for 1 day. LMP: 03/16/2022  Patient reports bright red spotting that started today with abdominal cramping.   OBJECTIVE: Appears well, in no apparent distress.  OB History     Gravida  1   Para  1   Term  1   Preterm      AB      Living  1      SAB      IAB      Ectopic      Multiple  0   Live Births  1          Home UPT Result: Positive In-Office UPT result: Positive I have reviewed the patient's medical, obstetrical, social, and family histories, and medications.   ASSESSMENT: Positive pregnancy test  PLAN Patient instructed to present to MAU for evalution.  Prenatal care to be completed at: [facility] Declines Rx for PNV, will buy otc

## 2022-05-15 ENCOUNTER — Other Ambulatory Visit: Payer: Self-pay

## 2022-05-15 ENCOUNTER — Inpatient Hospital Stay (HOSPITAL_COMMUNITY)
Admission: AD | Admit: 2022-05-15 | Discharge: 2022-05-15 | Disposition: A | Discharge status: Home / Self Care | Payer: Medicaid Other | Attending: Obstetrics & Gynecology | Admitting: Obstetrics & Gynecology

## 2022-05-15 ENCOUNTER — Inpatient Hospital Stay (HOSPITAL_COMMUNITY): Payer: Medicaid Other

## 2022-05-15 DIAGNOSIS — O26851 Spotting complicating pregnancy, first trimester: Secondary | ICD-10-CM

## 2022-05-15 DIAGNOSIS — Z3A08 8 weeks gestation of pregnancy: Secondary | ICD-10-CM | POA: Diagnosis not present

## 2022-05-15 DIAGNOSIS — O26891 Other specified pregnancy related conditions, first trimester: Secondary | ICD-10-CM | POA: Insufficient documentation

## 2022-05-15 DIAGNOSIS — Z3A01 Less than 8 weeks gestation of pregnancy: Secondary | ICD-10-CM | POA: Diagnosis not present

## 2022-05-15 DIAGNOSIS — O200 Threatened abortion: Secondary | ICD-10-CM | POA: Insufficient documentation

## 2022-05-15 DIAGNOSIS — R109 Unspecified abdominal pain: Secondary | ICD-10-CM | POA: Diagnosis not present

## 2022-05-15 LAB — URINALYSIS, ROUTINE W REFLEX MICROSCOPIC
Bilirubin Urine: NEGATIVE
Glucose, UA: NEGATIVE mg/dL
Hgb urine dipstick: NEGATIVE
Ketones, ur: NEGATIVE mg/dL
Nitrite: NEGATIVE
Protein, ur: 30 mg/dL — AB
Specific Gravity, Urine: 1.029 (ref 1.005–1.030)
pH: 5 (ref 5.0–8.0)

## 2022-05-15 LAB — WET PREP, GENITAL
Clue Cells Wet Prep HPF POC: NONE SEEN
Sperm: NONE SEEN
Trich, Wet Prep: NONE SEEN
WBC, Wet Prep HPF POC: <10 (ref <10)
Yeast Wet Prep HPF POC: NONE SEEN

## 2022-05-15 LAB — CBC
HCT: 36.2 % (ref 36.0–46.0)
Hemoglobin: 12.4 g/dL (ref 12.0–15.0)
MCH: 30.5 pg (ref 26.0–34.0)
MCHC: 34.3 g/dL (ref 30.0–36.0)
MCV: 89.2 fL (ref 80.0–100.0)
Platelets: 268 10*3/uL (ref 150–400)
RBC: 4.06 MIL/uL (ref 3.87–5.11)
RDW: 12.9 % (ref 11.5–15.5)
WBC: 8.5 10*3/uL (ref 4.0–10.5)
nRBC: 0 % (ref 0.0–0.2)

## 2022-05-15 LAB — ABO/RH
ABO/RH(D): O POS
Antibody Screen: NEGATIVE

## 2022-05-15 LAB — HCG, QUANTITATIVE, PREGNANCY: hCG, Beta Chain, Quant, S: 3440 m[IU]/mL — ABNORMAL HIGH (ref <5)

## 2022-05-15 MED ORDER — RHO D IMMUNE GLOBULIN 1500 UNIT/2ML IJ SOSY
300.0000 ug | PREFILLED_SYRINGE | Freq: Once | INTRAMUSCULAR | Status: AC
  Administered 2022-05-15: 300 ug via INTRAMUSCULAR
  Filled 2022-05-15: qty 2

## 2022-05-15 NOTE — MAU Note (Signed)
Wendy Adams is a 21 y.o. at [redacted]w[redacted]d here in MAU reporting: cramping, spotting and H/A.  Reports cramping is in lower abdomen and began yesterday.  States spotting with wiping began yesterday as well. LMP: N/A Onset of complaint: yesterday Pain score: 5/10 Vitals:   05/15/22 1041  BP: 125/80  Pulse: 81  Resp: 18  Temp: 98 F (36.7 C)  SpO2: 100%     FHT:N/A Lab orders placed from triage:   UA

## 2022-05-15 NOTE — MAU Provider Note (Signed)
History     CSN: [id number]  Arrival date and time: 05/15/22 1011   Event Date/Time   First Provider Initiated Contact with Patient 05/15/22 1315      Chief Complaint  Patient presents with   Abdominal Pain   Wendy Adams a  21 y.o. G2P1001 at [redacted]w[redacted]d presents to MAU with complaints of vaginal spotting with wiping and some occasional abdominal pain. Patient states that spotting started over the weekend. Endorses last intercourse 1 week ago. Denies postcoital spotting. Patient states when she got here she did not see any with wiping. She also endorses left sided pelvic cramping that is intermittent. Currently rating 4/10 and states she is not "really feeling anything right now." Denies other vaginal or urinary symptoms.           OB History     Gravida  2   Para  1   Term  1   Preterm      AB      Living  1      SAB      IAB      Ectopic      Multiple  0   Live Births  1           Past Medical History:  Diagnosis Date   Allergy    Angio-edema    Asthma    Eczema    Headache(784.0)    Multiple allergies     Past Surgical History:  Procedure Laterality Date   NO PAST SURGERIES      Family History  Problem Relation Age of Onset   Asthma Father    Eczema Father    Diabetes Maternal Grandmother    Lupus Maternal Grandmother    Diabetes Maternal Grandfather    Migraines Mother    Alcohol abuse Neg Hx    Arthritis Neg Hx    Birth defects Neg Hx    Cancer Neg Hx    COPD Neg Hx    Depression Neg Hx    Drug abuse Neg Hx    Early death Neg Hx    Hearing loss Neg Hx    Heart disease Neg Hx    Hyperlipidemia Neg Hx    Hypertension Neg Hx    Kidney disease Neg Hx    Learning disabilities Neg Hx    Mental illness Neg Hx    Mental retardation Neg Hx    Vision loss Neg Hx    Allergic rhinitis Neg Hx    Angioedema Neg Hx    Atopy Neg Hx    Immunodeficiency Neg Hx    Urticaria Neg Hx    Cystic fibrosis Neg Hx    Emphysema Neg Hx      Social History   Tobacco Use   Smoking status: Never   Smokeless tobacco: Never  Vaping Use   Vaping Use: Never used  Substance Use Topics   Alcohol use: No   Drug use: No    Allergies:  Allergies  Allergen Reactions   Food Anaphylaxis, Hives and Other (See Comments)    Pt is allergic to peanut butter and [place].    Peanut Oil Anaphylaxis   Beef-Derived Products Hives   Chicken Protein Hives   Eggs Or Egg-Derived Products Hives   Pork-Derived Products Hives   Shellfish Allergy Hives    Medications Prior to Admission  Medication Sig Dispense Refill Last Dose   albuterol (VENTOLIN HFA) 108 (90 Base) MCG/ACT inhaler Inhale 2 puffs every 4-6  hours as needed for cough, wheeze, tightness in chest, or shortness of breath. 18 g 1    amoxicillin (AMOXIL) 500 MG capsule Take 1 capsule (500 mg total) by mouth 2 (two) times daily. (Patient not taking: Reported on 05/14/2022) 20 capsule 0    Cetirizine HCl 10 MG CAPS Take by mouth.      EPINEPHrine 0.3 mg/0.3 mL IJ SOAJ injection Inject 0.3 mg into the muscle as needed for anaphylaxis. 1 each 2    fluticasone (FLONASE) 50 MCG/ACT nasal spray Place into the nose.      montelukast (SINGULAIR) 10 MG tablet Take by mouth.       Review of Systems  Constitutional:  Negative for chills, fatigue and fever.  Eyes:  Negative for pain and visual disturbance.  Respiratory:  Negative for apnea, shortness of breath and wheezing.   Cardiovascular:  Negative for chest pain and palpitations.  Gastrointestinal:  Negative for abdominal pain, constipation, diarrhea, nausea and vomiting.  Genitourinary:  Positive for pelvic pain and vaginal bleeding. Negative for difficulty urinating, dysuria, vaginal discharge and vaginal pain.  Musculoskeletal:  Negative for back pain.  Neurological:  Negative for seizures, weakness and headaches.  Psychiatric/Behavioral:  Negative for suicidal ideas.    Physical Exam   Blood pressure 125/80, pulse 81,  temperature 98 F (36.7 C), temperature source Oral, resp. rate 18, height 5\' 3"  (1.6 m), weight 58.7 kg, last menstrual period 03/16/2022, SpO2 100 %.  Physical Exam Vitals and nursing note reviewed.  Constitutional:      General: She is not in acute distress.    Appearance: Normal appearance.  HENT:     Head: Normocephalic.  Pulmonary:     Effort: Pulmonary effort is normal.  Abdominal:     Palpations: Abdomen is soft.     Tenderness: There is no right CVA tenderness or left CVA tenderness.  Musculoskeletal:     Cervical back: Normal range of motion.  Skin:    General: Skin is warm and dry.  Neurological:     Mental Status: She is alert and oriented to person, place, and time.  Psychiatric:        Mood and Affect: Mood normal.     MAU Course  Procedures Orders Placed This Encounter  Procedures   Wet prep, genital   Culture, OB Urine   03/18/2022 OB LESS THAN 14 WEEKS WITH OB TRANSVAGINAL   Urinalysis, Routine w reflex microscopic Urine, Clean Catch   CBC   hCG, quantitative, pregnancy   Diet NPO time specified   ABO/Rh   Rh IG workup (includes ABO/Rh)   Discharge patient   Results for orders placed or performed during the hospital encounter of 05/15/22 (from the past 24 hour(s))  Urinalysis, Routine w reflex microscopic Urine, Clean Catch     Status: Abnormal   Collection Time: 05/15/22 10:45 AM  Result Value Ref Range   Color, Urine YELLOW YELLOW   APPearance HAZY (A) CLEAR   Specific Gravity, Urine 1.029 1.005 - 1.030   pH 5.0 5.0 - 8.0   Glucose, UA NEGATIVE NEGATIVE mg/dL   Hgb urine dipstick NEGATIVE NEGATIVE   Bilirubin Urine NEGATIVE NEGATIVE   Ketones, ur NEGATIVE NEGATIVE mg/dL   Protein, ur 30 (A) NEGATIVE mg/dL   Nitrite NEGATIVE NEGATIVE   Leukocytes,Ua SMALL (A) NEGATIVE   RBC / HPF 11-20 0 - 5 RBC/hpf   WBC, UA 0-5 0 - 5 WBC/hpf   Bacteria, UA RARE (A) NONE SEEN   Squamous Epithelial /  LPF 11-20 0 - 5   Mucus PRESENT   Wet prep, genital     Status:  None   Collection Time: 05/15/22 11:36 AM   Specimen: Vaginal  Result Value Ref Range   Yeast Wet Prep HPF POC NONE SEEN NONE SEEN   Trich, Wet Prep NONE SEEN NONE SEEN   Clue Cells Wet Prep HPF POC NONE SEEN NONE SEEN   WBC, Wet Prep HPF POC <10 <10   Sperm NONE SEEN   CBC     Status: None   Collection Time: 05/15/22 11:43 AM  Result Value Ref Range   WBC 8.5 4.0 - 10.5 K/uL   RBC 4.06 3.87 - 5.11 MIL/uL   Hemoglobin 12.4 12.0 - 15.0 g/dL   HCT 50.2 77.4 - 12.8 %   MCV 89.2 80.0 - 100.0 fL   MCH 30.5 26.0 - 34.0 pg   MCHC 34.3 30.0 - 36.0 g/dL   RDW 78.6 76.7 - 20.9 %   Platelets 268 150 - 400 K/uL   nRBC 0.0 0.0 - 0.2 %  ABO/Rh     Status: None   Collection Time: 05/15/22 11:43 AM  Result Value Ref Range   ABO/RH(D) O POS    Antibody Screen      NEG Performed at [facility], [address] [place], [place] [address]   hCG, quantitative, pregnancy     Status: Abnormal   Collection Time: 05/15/22 11:43 AM  Result Value Ref Range   hCG, Beta Chain, Quant, S 3,440 (H) <5 mIU/mL  Rh IG workup (includes ABO/Rh)     Status: None (Preliminary result)   Collection Time: 05/15/22  1:13 PM  Result Value Ref Range   Gestational Age(Wks)      8 Performed at [facility], [address] [place], [place] [address]    Unit Number [id number]    Blood Component Type RHIG    Unit division 00    Status of Unit ISSUED    Transfusion Status OK TO TRANSFUSE    Meds ordered this encounter  Medications   rho (d) immune globulin (RHIG/RHOPHYLAC) injection 300 mcg   US OB LESS THAN 14 WEEKS WITH OB TRANSVAGINAL  Result Date: 05/15/2022 CLINICAL DATA:  Spotting abdominal cramping. Estimated gestational age by last menstrual period equals 8 weeks 4 days EXAM: OBSTETRIC <14 WK [place] AND TRANSVAGINAL OB US TECHNIQUE: Both transabdominal and transvaginal ultrasound examinations were performed for complete evaluation of the gestation as well as the maternal uterus, adnexal  regions, and pelvic cul-de-sac. Transvaginal technique was performed to assess early pregnancy. COMPARISON:  None Available. FINDINGS: Intrauterine gestational sac: Single Yolk sac:  Not identified Embryo:  Not identified MSD: 5 mm   5 w   2 d Subchorionic hemorrhage:  None visualized. Maternal uterus/adnexae: Normal ovaries. Potential corpus luteal cyst in the LEFT ovary. Trace free fluid IMPRESSION: Probable early intrauterine gestational sac, but no yolk sac, fetal pole, or cardiac activity yet visualized. Recommend follow-up quantitative B-HCG levels and follow-up US in 14 days to assess viability. This recommendation follows SRU consensus guidelines: Diagnostic Criteria for Nonviable Pregnancy Early in the First Trimester. Malva Limes Med 2013; 656:8127-51. Electronically Signed   By: Genevive Bi M.D.   On: 05/15/2022 12:20      MDM Lab results reviewed and interpreted by me   - UA reflexed to Culture  - ABO status with O Positive varying RH status. Has had results of both Rh neg  and Rh Positive. Plan to give Rhogam in the presence of vaginal bleeding.  - Wet Prep unremarkable.  -GC/ Chlamydia - PENDING  - [place] reveled a probable gestational sac without a yolk sac or fetal pole. Recommended to get serial HCG testing and a repeat US in 10-14 days. Lab follow up scheduled for Thursday Aug 31 at [facility] at 9:30 am.  - Quant not back, Patient desires to leave, has to pick up her Son at 2 pm. Patient clinically stable    Assessment and Plan   1. Abortion, threatened, early pregnancy   2. Spotting affecting pregnancy in first trimester   3. [redacted] weeks gestation of pregnancy    - Discussed findings of [place] today and noted that this may be a early pregnancy but not definite. Recommended follow up BHcg in 48 hours. Discussed diagnosis of Threatened Miscarriage in early pregnancy.  - Follow up Scheduled.  - Recommended to take PO tylenol as needed for pain. Admin instruction provided at bedside.  -  Rhogam given in the presence of vaginal bleeding and spotting in MAU prior to discharge.  - Early Bleeding precautions and worsening symptoms of when to return to MAU discussed.  - Patient discharged home in stable condition and may return to MAU as needed.   Claudette Head, MSN CNM  05/15/2022, 1:15 PM

## 2022-05-15 NOTE — Progress Notes (Signed)
Pt instructed to self swab to obtain vaginal cultures (wet prep & GC/Chlamydia).  Understanding verbalized and cultures done

## 2022-05-16 LAB — GC/CHLAMYDIA PROBE AMP (~~LOC~~) NOT AT ARMC
Chlamydia: NEGATIVE
Comment: NEGATIVE
Comment: NORMAL
Neisseria Gonorrhea: NEGATIVE

## 2022-05-16 LAB — RH IG WORKUP (INCLUDES ABO/RH)
Gestational Age(Wks): 8
Unit division: 0

## 2022-05-16 LAB — CULTURE, OB URINE

## 2022-05-17 ENCOUNTER — Telehealth: Payer: Self-pay | Admitting: *Deleted

## 2022-05-17 ENCOUNTER — Other Ambulatory Visit: Payer: Medicaid Other

## 2022-05-17 NOTE — Telephone Encounter (Signed)
Wendy Adams stat bhcg today. I called her and informed her she missed appointment. She states she went to her PCP and he drew blood and it was negative. I explained they do not usually check and follow up stat bhcg and offered to make appointment for tomorrow. She agreed to 10am tomorrow. I also discussed since she was seen at [facility] once, she could go there if she would prefer and she could call them. She states she will come to appointment tomorrow. Nancy Fetter

## 2022-05-17 NOTE — Telephone Encounter (Signed)
Addendum: notified by front office Stat bhcg should be drawn at [facility] as they have initated her care. And they have been notified of need for stat bhcg.   I called Braelyn and explained I need to cancel her appointment and she should call [id number] to schedule. She states she is not going to go because she has a new ob and is seeing Nestor Ramp 06/04/22. I explained she should get follow up before then and should go to hospital if she has severe pain or heavy bleeding. She voices understanding. Nancy Fetter

## 2022-05-18 ENCOUNTER — Ambulatory Visit (INDEPENDENT_AMBULATORY_CARE_PROVIDER_SITE_OTHER): Payer: Medicaid Other | Admitting: Emergency Medicine

## 2022-05-18 ENCOUNTER — Telehealth: Payer: Self-pay | Admitting: Emergency Medicine

## 2022-05-18 DIAGNOSIS — O200 Threatened abortion: Secondary | ICD-10-CM

## 2022-05-18 LAB — BETA HCG QUANT (REF LAB): hCG Quant: 5332 m[IU]/mL

## 2022-05-18 NOTE — Progress Notes (Signed)
Patient presents for bchg follow up. Denies vaginal bleeding, abdominal cramping.

## 2022-05-18 NOTE — Telephone Encounter (Cosign Needed)
Beta hCg results reviewed with Donavan Foil, MD. TC to patient to discuss results and plan.   Per Donavan Foil, MD, Patient instructed to present to MAU (48 hrs from today) for repeat blood work and ultrasound. Patient agreed and verbalized understanding.  Precautions given to present to ED/MAU.

## 2022-05-19 ENCOUNTER — Inpatient Hospital Stay (HOSPITAL_COMMUNITY)
Admission: AD | Admit: 2022-05-19 | Discharge: 2022-05-19 | Disposition: A | Discharge status: Home / Self Care | Payer: Medicaid Other | Attending: Obstetrics and Gynecology | Admitting: Obstetrics and Gynecology

## 2022-05-19 ENCOUNTER — Encounter (HOSPITAL_COMMUNITY): Payer: Self-pay | Admitting: Obstetrics and Gynecology

## 2022-05-19 DIAGNOSIS — Z3A09 9 weeks gestation of pregnancy: Secondary | ICD-10-CM

## 2022-05-19 DIAGNOSIS — O3680X0 Pregnancy with inconclusive fetal viability, not applicable or unspecified: Secondary | ICD-10-CM

## 2022-05-19 NOTE — MAU Note (Signed)
...  Wendy Adams is a 21 y.o. at [redacted]w[redacted]d here in MAU reporting: Here for repeat beta hCG and ultrasound. She reports she spoke with the on call nurse yesterday who told her to come be seen in MAU but she did not realize it was for tomorrow and not today. She reports tomorrow she is leaving at 1000 for her grandfather's funeral and will not return until Wednesday. She reports she may potentially be able to come at 0700 if she is able to leave in time. Denies vaginal bleeding. Denies pain.

## 2022-05-19 NOTE — MAU Provider Note (Signed)
Event Date/Time   First Provider Initiated Contact with Patient 05/19/22 1009      S Ms. Wendy Adams is a 21 y.o. G2P1001 patient who presents to MAU today for repeat HCG lab work. She did not realize that today was not 48 hours from when she last her HCG level drawn. She is planning to leave to go to [facility] for her grandfather's funeral tomorrow at 1000. She will return tomorrow for repeat HCG and U/S around 0600. She receives [facility] with [facility]; next appt is 05/28/2022.   O BP 134/65 (BP Location: Right Arm)   Pulse 98   Resp 17   Ht 5\' 3"  (1.6 m)   Wt 58.7 kg   LMP 03/16/2022 (Exact Date)   SpO2 99%   BMI 22.94 kg/m   Physical Exam Vitals and nursing note reviewed.  Constitutional:      Appearance: Normal appearance. She is normal weight.  Cardiovascular:     Rate and Rhythm: Normal rate.  Pulmonary:     Effort: Pulmonary effort is normal.  Abdominal:     Palpations: Abdomen is soft.  Genitourinary:    Comments: Not indicated Musculoskeletal:        General: Normal range of motion.  Skin:    General: Skin is warm and dry.  Neurological:     Mental Status: She is alert and oriented to person, place, and time.  Psychiatric:        Mood and Affect: Mood normal.        Behavior: Behavior normal.        Thought Content: Thought content normal.        Judgment: Judgment normal.     A Medical screening exam complete 1. Pregnancy of unknown anatomic location  2. [redacted] weeks gestation of pregnancy    P Discharge from MAU in stable condition Return to MAU on 05/20/2022 anytime prior to leaving town Orders for HCG and OB <14 wks U/S placed in sign and held for RN to release Warning signs for worsening condition that would warrant emergency follow-up discussed Patient may return to MAU as needed for pregnancy  07/20/2022, CNM 05/19/2022 10:18 AM

## 2022-05-20 ENCOUNTER — Inpatient Hospital Stay (HOSPITAL_COMMUNITY)
Admission: AD | Admit: 2022-05-20 | Discharge: 2022-05-20 | Disposition: A | Discharge status: Home / Self Care | Payer: Medicaid Other | Attending: Family Medicine | Admitting: Family Medicine

## 2022-05-20 ENCOUNTER — Inpatient Hospital Stay (HOSPITAL_COMMUNITY): Payer: Medicaid Other | Attending: Physician Assistant

## 2022-05-20 ENCOUNTER — Other Ambulatory Visit: Payer: Self-pay

## 2022-05-20 DIAGNOSIS — Z3490 Encounter for supervision of normal pregnancy, unspecified, unspecified trimester: Secondary | ICD-10-CM | POA: Diagnosis not present

## 2022-05-20 DIAGNOSIS — Z3A09 9 weeks gestation of pregnancy: Secondary | ICD-10-CM | POA: Diagnosis not present

## 2022-05-20 DIAGNOSIS — O3680X0 Pregnancy with inconclusive fetal viability, not applicable or unspecified: Secondary | ICD-10-CM | POA: Insufficient documentation

## 2022-05-20 DIAGNOSIS — Z09 Encounter for follow-up examination after completed treatment for conditions other than malignant neoplasm: Secondary | ICD-10-CM

## 2022-05-20 LAB — HCG, QUANTITATIVE, PREGNANCY: hCG, Beta Chain, Quant, S: 12245 m[IU]/mL — ABNORMAL HIGH (ref <5)

## 2022-05-20 NOTE — MAU Provider Note (Signed)
Subjective:  Wendy Adams is a 21 y.o. G2P1001 at [redacted]w[redacted]d who presents today for FU BHCG. She was seen on 8/29. Results from that day show single IUP on [place], and HCG 3440. She denies vaginal bleeding. She denies abdominal or pelvic pain.   Objective:  Physical Exam  Nursing note and vitals reviewed. Constitutional: She is oriented to person, place, and time. She appears well-developed and well-nourished. No distress.  HENT:  Head: Normocephalic.  Cardiovascular: Normal rate.  Respiratory: Effort normal.  GI: Soft. There is no tenderness.  Neurological: She is alert and oriented to person, place, and time. Skin: Skin is warm and dry.  Psychiatric: She has a normal mood and affect.   Results for orders placed or performed during the hospital encounter of 05/20/22 (from the past 24 hour(s))  hCG, quantitative, pregnancy     Status: Abnormal   Collection Time: 05/20/22  9:00 AM  Result Value Ref Range   hCG, Beta Chain, Quant, S 12,245 (H) <5 mIU/mL    She needs [place] 10 days from first [place]. Single gestation sac seen on 8/29. Quant collected today and reviewed with patient.   Assessment/Plan: Pregnancy of unknown location HCG did  rise appropriately FU [place] next week, order placed.  Return to MAU if symptoms worsen   Carra Brindley, Victorino Dike I, NP. 05/20/2022 1:44 PM

## 2022-05-20 NOTE — MAU Note (Signed)
Pt reports to mau for follow up lab work and [place]. Denies changes to pain or bleeding

## 2022-05-28 DIAGNOSIS — O26841 Uterine size-date discrepancy, first trimester: Secondary | ICD-10-CM | POA: Diagnosis not present

## 2022-05-28 DIAGNOSIS — R87612 Low grade squamous intraepithelial lesion on cytologic smear of cervix (LGSIL): Secondary | ICD-10-CM | POA: Diagnosis not present

## 2022-05-28 DIAGNOSIS — Z3201 Encounter for pregnancy test, result positive: Secondary | ICD-10-CM | POA: Diagnosis not present

## 2022-05-28 DIAGNOSIS — Z113 Encounter for screening for infections with a predominantly sexual mode of transmission: Secondary | ICD-10-CM | POA: Diagnosis not present

## 2022-05-28 DIAGNOSIS — Z3480 Encounter for supervision of other normal pregnancy, unspecified trimester: Secondary | ICD-10-CM | POA: Diagnosis not present

## 2022-05-28 DIAGNOSIS — Z124 Encounter for screening for malignant neoplasm of cervix: Secondary | ICD-10-CM | POA: Diagnosis not present

## 2022-05-29 ENCOUNTER — Inpatient Hospital Stay: Admission: RE | Admit: 2022-05-29 | Payer: Medicaid Other | Source: Ambulatory Visit

## 2022-06-11 ENCOUNTER — Other Ambulatory Visit (HOSPITAL_COMMUNITY): Payer: Self-pay | Admitting: Obstetrics and Gynecology

## 2022-06-11 ENCOUNTER — Ambulatory Visit (INDEPENDENT_AMBULATORY_CARE_PROVIDER_SITE_OTHER): Payer: Medicaid Other

## 2022-06-11 ENCOUNTER — Ambulatory Visit
Admission: RE | Admit: 2022-06-11 | Discharge: 2022-06-11 | Disposition: A | Discharge status: Home / Self Care | Payer: Medicaid Other | Source: Ambulatory Visit | Attending: Obstetrics and Gynecology | Admitting: Obstetrics and Gynecology

## 2022-06-11 VITALS — BP 116/71 | HR 78 | Wt 132.2 lb

## 2022-06-11 DIAGNOSIS — Z3490 Encounter for supervision of normal pregnancy, unspecified, unspecified trimester: Secondary | ICD-10-CM | POA: Diagnosis present

## 2022-06-11 DIAGNOSIS — Z3A08 8 weeks gestation of pregnancy: Secondary | ICD-10-CM | POA: Diagnosis not present

## 2022-06-11 DIAGNOSIS — Z09 Encounter for follow-up examination after completed treatment for conditions other than malignant neoplasm: Secondary | ICD-10-CM | POA: Diagnosis present

## 2022-06-11 DIAGNOSIS — Z3491 Encounter for supervision of normal pregnancy, unspecified, first trimester: Secondary | ICD-10-CM | POA: Insufficient documentation

## 2022-06-11 DIAGNOSIS — Z712 Person consulting for explanation of examination or test findings: Secondary | ICD-10-CM

## 2022-06-11 DIAGNOSIS — O3680X1 Pregnancy with inconclusive fetal viability, fetus 1: Secondary | ICD-10-CM | POA: Diagnosis not present

## 2022-06-11 NOTE — Progress Notes (Signed)
Ultrasound Results Visit   Pt here today following viability [place] for results. Results reviewed with Ernestina Patches, MD  who finds single, living IUP with small subchorionic hemorrhage present and recommends patient begin prenatal care.  Reviewed results, dating, and provider recommendation with patient:   EDD: 01/18/23 8w 3d today  Reviewed medications and allergies with patient; patient is taking daily prenatal vitamin. Recommended pt establish prenatal care. Pt states she is established patient of [facility]. States they have performed one ultrasound during this pregnancy. Plans to follow up with [facility] on 07/04/22. Return precautions and availability of MAU reviewed.  Apolonio Schneiders RN 06/11/22

## 2022-06-22 ENCOUNTER — Ambulatory Visit (HOSPITAL_COMMUNITY): Payer: Self-pay

## 2022-06-23 ENCOUNTER — Ambulatory Visit (HOSPITAL_COMMUNITY): Payer: Self-pay

## 2022-06-24 ENCOUNTER — Encounter (HOSPITAL_COMMUNITY): Payer: Self-pay | Admitting: Obstetrics

## 2022-06-24 ENCOUNTER — Inpatient Hospital Stay (HOSPITAL_COMMUNITY): Payer: Medicaid Other

## 2022-06-24 ENCOUNTER — Inpatient Hospital Stay (HOSPITAL_COMMUNITY)
Admission: AD | Admit: 2022-06-24 | Discharge: 2022-06-24 | Disposition: A | Discharge status: Home / Self Care | Payer: Medicaid Other | Attending: Obstetrics | Admitting: Obstetrics

## 2022-06-24 ENCOUNTER — Other Ambulatory Visit: Payer: Self-pay

## 2022-06-24 DIAGNOSIS — O208 Other hemorrhage in early pregnancy: Secondary | ICD-10-CM | POA: Diagnosis not present

## 2022-06-24 DIAGNOSIS — R109 Unspecified abdominal pain: Secondary | ICD-10-CM | POA: Insufficient documentation

## 2022-06-24 DIAGNOSIS — T7491XA Unspecified adult maltreatment, confirmed, initial encounter: Secondary | ICD-10-CM

## 2022-06-24 DIAGNOSIS — O418X10 Other specified disorders of amniotic fluid and membranes, first trimester, not applicable or unspecified: Secondary | ICD-10-CM | POA: Diagnosis not present

## 2022-06-24 DIAGNOSIS — O23591 Infection of other part of genital tract in pregnancy, first trimester: Secondary | ICD-10-CM | POA: Diagnosis not present

## 2022-06-24 DIAGNOSIS — O468X1 Other antepartum hemorrhage, first trimester: Secondary | ICD-10-CM

## 2022-06-24 DIAGNOSIS — B9689 Other specified bacterial agents as the cause of diseases classified elsewhere: Secondary | ICD-10-CM | POA: Insufficient documentation

## 2022-06-24 DIAGNOSIS — Z3A10 10 weeks gestation of pregnancy: Secondary | ICD-10-CM | POA: Diagnosis not present

## 2022-06-24 DIAGNOSIS — O26891 Other specified pregnancy related conditions, first trimester: Secondary | ICD-10-CM | POA: Diagnosis not present

## 2022-06-24 LAB — CBC WITH DIFFERENTIAL/PLATELET
Abs Immature Granulocytes: 0.07 10*3/uL (ref 0.00–0.07)
Basophils Absolute: 0 10*3/uL (ref 0.0–0.1)
Basophils Relative: 0 %
Eosinophils Absolute: 0 10*3/uL (ref 0.0–0.5)
Eosinophils Relative: 0 %
HCT: 33 % — ABNORMAL LOW (ref 36.0–46.0)
Hemoglobin: 11.5 g/dL — ABNORMAL LOW (ref 12.0–15.0)
Immature Granulocytes: 1 %
Lymphocytes Relative: 8 %
Lymphs Abs: 1.2 10*3/uL (ref 0.7–4.0)
MCH: 30.7 pg (ref 26.0–34.0)
MCHC: 34.8 g/dL (ref 30.0–36.0)
MCV: 88 fL (ref 80.0–100.0)
Monocytes Absolute: 0.6 10*3/uL (ref 0.1–1.0)
Monocytes Relative: 4 %
Neutro Abs: 12.5 10*3/uL — ABNORMAL HIGH (ref 1.7–7.7)
Neutrophils Relative %: 87 %
Platelets: 262 10*3/uL (ref 150–400)
RBC: 3.75 MIL/uL — ABNORMAL LOW (ref 3.87–5.11)
RDW: 13 % (ref 11.5–15.5)
WBC: 14.4 10*3/uL — ABNORMAL HIGH (ref 4.0–10.5)
nRBC: 0.1 % (ref 0.0–0.2)

## 2022-06-24 LAB — COMPREHENSIVE METABOLIC PANEL
ALT: 16 U/L (ref 0–44)
AST: 20 U/L (ref 15–41)
Albumin: 3.8 g/dL (ref 3.5–5.0)
Alkaline Phosphatase: 43 U/L (ref 38–126)
Anion gap: 10 (ref 5–15)
BUN: 6 mg/dL (ref 6–20)
CO2: 22 mmol/L (ref 22–32)
Calcium: 9.1 mg/dL (ref 8.9–10.3)
Chloride: 105 mmol/L (ref 98–111)
Creatinine, Ser: 0.57 mg/dL (ref 0.44–1.00)
GFR, Estimated: >60 mL/min (ref >60)
Glucose, Bld: 76 mg/dL (ref 70–99)
Potassium: 3.4 mmol/L — ABNORMAL LOW (ref 3.5–5.1)
Sodium: 137 mmol/L (ref 135–145)
Total Bilirubin: 0.4 mg/dL (ref 0.3–1.2)
Total Protein: 7.4 g/dL (ref 6.5–8.1)

## 2022-06-24 LAB — URINALYSIS, ROUTINE W REFLEX MICROSCOPIC
Bilirubin Urine: NEGATIVE
Glucose, UA: NEGATIVE mg/dL
Hgb urine dipstick: NEGATIVE
Ketones, ur: 20 mg/dL — AB
Nitrite: NEGATIVE
Protein, ur: 30 mg/dL — AB
Specific Gravity, Urine: 1.028 (ref 1.005–1.030)
pH: 5 (ref 5.0–8.0)

## 2022-06-24 LAB — WET PREP, GENITAL
Sperm: NONE SEEN
Trich, Wet Prep: NONE SEEN
WBC, Wet Prep HPF POC: >=10 — AB (ref <10)
Yeast Wet Prep HPF POC: NONE SEEN

## 2022-06-24 MED ORDER — METRONIDAZOLE 500 MG PO TABS: 500.0000 mg | ORAL_TABLET | Freq: Two times a day (BID) | ORAL | 0 refills | Status: DC

## 2022-06-24 MED ORDER — ACETAMINOPHEN 500 MG PO TABS
1000.0000 mg | ORAL_TABLET | Freq: Once | ORAL | Status: AC
  Administered 2022-06-24: 1000 mg via ORAL
  Filled 2022-06-24: qty 2
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
## 2022-06-25 LAB — CULTURE, OB URINE: Culture: NO GROWTH

## 2022-06-25 LAB — GC/CHLAMYDIA PROBE AMP (~~LOC~~) NOT AT ARMC
Chlamydia: NEGATIVE
Comment: NEGATIVE
Comment: NORMAL
Neisseria Gonorrhea: NEGATIVE

## 2022-07-04 DIAGNOSIS — Z3480 Encounter for supervision of other normal pregnancy, unspecified trimester: Secondary | ICD-10-CM | POA: Diagnosis not present

## 2022-07-04 DIAGNOSIS — Z3481 Encounter for supervision of other normal pregnancy, first trimester: Secondary | ICD-10-CM | POA: Diagnosis not present

## 2022-07-04 LAB — OB RESULTS CONSOLE RPR: RPR: NONREACTIVE

## 2022-07-04 LAB — OB RESULTS CONSOLE HIV ANTIBODY (ROUTINE TESTING): HIV: NONREACTIVE

## 2022-07-04 LAB — OB RESULTS CONSOLE RUBELLA ANTIBODY, IGM: Rubella: IMMUNE

## 2022-07-04 LAB — OB RESULTS CONSOLE HEPATITIS B SURFACE ANTIGEN: Hepatitis B Surface Ag: NEGATIVE

## 2022-07-04 LAB — HEPATITIS C ANTIBODY: HCV Ab: NEGATIVE

## 2022-08-01 DIAGNOSIS — R769 Abnormal immunological finding in serum, unspecified: Secondary | ICD-10-CM | POA: Diagnosis not present

## 2022-08-01 DIAGNOSIS — Z3482 Encounter for supervision of other normal pregnancy, second trimester: Secondary | ICD-10-CM | POA: Diagnosis not present

## 2022-08-28 DIAGNOSIS — Z363 Encounter for antenatal screening for malformations: Secondary | ICD-10-CM | POA: Diagnosis not present

## 2022-08-28 DIAGNOSIS — Z3A19 19 weeks gestation of pregnancy: Secondary | ICD-10-CM | POA: Diagnosis not present

## 2022-09-05 ENCOUNTER — Ambulatory Visit (HOSPITAL_COMMUNITY): Admit: 2022-09-05 | Payer: Medicaid Other | Source: Home / Self Care

## 2022-09-05 ENCOUNTER — Ambulatory Visit
Admission: EM | Admit: 2022-09-05 | Discharge: 2022-09-05 | Disposition: A | Discharge status: Home / Self Care | Payer: Medicaid Other | Attending: Internal Medicine | Admitting: Internal Medicine

## 2022-09-05 DIAGNOSIS — H65192 Other acute nonsuppurative otitis media, left ear: Secondary | ICD-10-CM

## 2022-09-05 MED ORDER — AMOXICILLIN 875 MG PO TABS: 875.0000 mg | ORAL_TABLET | Freq: Two times a day (BID) | ORAL | 0 refills | Status: DC

## 2022-09-05 MED ORDER — AMOXICILLIN 875 MG PO TABS: 875.0000 mg | ORAL_TABLET | Freq: Two times a day (BID) | ORAL | 0 refills | Status: AC

## 2022-09-05 NOTE — ED Provider Notes (Signed)
[facility]    CSN: [id number] Arrival date & time: 09/05/22  1753      History   Chief Complaint Chief Complaint  Patient presents with   left ear popping    HPI Wendy Adams is a 21 y.o. female.   Patient presents with left ear pain and sensation of "popping" that started today.  Patient denies any associated upper respiratory symptoms, cough, fever.  Denies trauma or foreign body to the ear.  Denies any drainage from the ear.  Patient does not report taking medications for symptoms.  Patient reports that she is currently 5 months pregnant.  Patient's chart reports that she has a legal guardian.  Patient adamantly declines that she does not have a legal guardian and states that this was placed accidentally as her mother is her emergency contact and not her legal guardian.     Past Medical History:  Diagnosis Date   Allergy    Angio-edema    Asthma    Eczema    Headache(784.0)    Multiple allergies     Patient Active Problem List   Diagnosis Date Noted   LGSIL on Pap smear of cervix 06/29/2021   Mild intermittent asthma without complication 08/24/2019   Seasonal and perennial allergic rhinitis 08/24/2019   Asthma, well controlled 04/07/2018   Seasonal allergies 04/07/2018   Menstrual cramps 04/07/2018   Hyperthyroidism 02/26/2018   Abnormal thyroid blood test 01/25/2018   Goiter 01/25/2018   Unintentional weight loss 01/25/2018   Normal vaginal delivery 03/08/2017   Perennial and seasonal allergic rhinoconjunctivitis 12/02/2015   Moderate persistent asthma 12/07/2013   Other atopic dermatitis 12/07/2013   Allergy with anaphylaxis due to food, subsequent encounter 12/07/2013    Past Surgical History:  Procedure Laterality Date   NO PAST SURGERIES      OB History     Gravida  2   Para  1   Term  1   Preterm      AB      Living  1      SAB      IAB      Ectopic      Multiple  0   Live Births  1            Home  Medications    Prior to Admission medications   Medication Sig Start Date End Date Taking? Authorizing Provider  albuterol (VENTOLIN HFA) 108 (90 Base) MCG/ACT inhaler Inhale 2 puffs every 4-6 hours as needed for cough, wheeze, tightness in chest, or shortness of breath. 04/02/22   Nehemiah Settle, FNP  amoxicillin (AMOXIL) 875 MG tablet Take 1 tablet (875 mg total) by mouth 2 (two) times daily for 7 days. 09/05/22 09/12/22  Gustavus Bryant, FNP  Cetirizine HCl 10 MG CAPS Take by mouth as needed. 02/14/22   [provider]  EPINEPHrine 0.3 mg/0.3 mL IJ SOAJ injection Inject 0.3 mg into the muscle as needed for anaphylaxis. 04/02/22   Nehemiah Settle, FNP  fluticasone (FLONASE) 50 MCG/ACT nasal spray Place into the nose as needed. 07/06/20   [provider]  metroNIDAZOLE (FLAGYL) 500 MG tablet Take 1 tablet (500 mg total) by mouth 2 (two) times daily. 06/24/22   Carlynn Herald, CNM  montelukast (SINGULAIR) 10 MG tablet Take by mouth as needed. 02/14/22   [provider]  Prenatal MV & Min w/FA-DHA (PRENATAL ADULT GUMMY/DHA/FA) 0.4-25 MG CHEW Chew by mouth daily.    [provider]  Family History Family History  Problem Relation Age of Onset   Asthma Father    Eczema Father    Diabetes Maternal Grandmother    Lupus Maternal Grandmother    Diabetes Maternal Grandfather    Migraines Mother    Alcohol abuse Neg Hx    Arthritis Neg Hx    Birth defects Neg Hx    Cancer Neg Hx    COPD Neg Hx    Depression Neg Hx    Drug abuse Neg Hx    Early death Neg Hx    Hearing loss Neg Hx    Heart disease Neg Hx    Hyperlipidemia Neg Hx    Hypertension Neg Hx    Kidney disease Neg Hx    Learning disabilities Neg Hx    Mental illness Neg Hx    Mental retardation Neg Hx    Vision loss Neg Hx    Allergic rhinitis Neg Hx    Angioedema Neg Hx    Atopy Neg Hx    Immunodeficiency Neg Hx    Urticaria Neg Hx    Cystic fibrosis Neg Hx    Emphysema Neg Hx      Social History Social History   Tobacco Use   Smoking status: Never   Smokeless tobacco: Never  Vaping Use   Vaping Use: Never used  Substance Use Topics   Alcohol use: No   Drug use: No     Allergies   Food, Peanut oil, Beef-derived products, Chicken protein, Eggs or egg-derived products, Pork-derived products, and Shellfish allergy   Review of Systems Review of Systems Per HPI  Physical Exam Triage Vital Signs ED Triage Vitals [09/05/22 1921]  Enc Vitals Group     BP 92/63     Pulse Rate 92     Resp 16     Temp 98.2 F (36.8 C)     Temp Source Oral     SpO2 98 %     Weight      Height      Head Circumference      Peak Flow      Pain Score 0     Pain Loc      Pain Edu?      Excl. in GC?    No data found.  Updated Vital Signs BP 92/63 (BP Location: Left Arm)   Pulse 92   Temp 98.2 F (36.8 C) (Oral)   Resp 16   LMP 03/16/2022 (Exact Date)   SpO2 98%   Visual Acuity Right Eye Distance:   Left Eye Distance:   Bilateral Distance:    Right Eye Near:   Left Eye Near:    Bilateral Near:     Physical Exam Constitutional:      General: She is not in acute distress.    Appearance: Normal appearance. She is not toxic-appearing or diaphoretic.  HENT:     Head: Normocephalic and atraumatic.     Right Ear: Tympanic membrane, ear canal and external ear normal.     Left Ear: Ear canal and external ear normal. Tympanic membrane is erythematous. Tympanic membrane is not perforated or bulging.  Eyes:     Extraocular Movements: Extraocular movements intact.     Conjunctiva/sclera: Conjunctivae normal.  Pulmonary:     Effort: Pulmonary effort is normal.  Neurological:     General: No focal deficit present.     Mental Status: She is alert and oriented to person, place, and time. Mental status is  at baseline.  Psychiatric:        Mood and Affect: Mood normal.        Behavior: Behavior normal.        Thought Content: Thought content normal.         Judgment: Judgment normal.      UC Treatments / Results  Labs (all labs ordered are listed, but only abnormal results are displayed) Labs Reviewed - No data to display  EKG   Radiology No results found.  Procedures Procedures (including critical care time)  Medications Ordered in UC Medications - No data to display  Initial Impression / Assessment and Plan / UC Course  I have reviewed the triage vital signs and the nursing notes.  Pertinent labs & imaging results that were available during my care of the patient were reviewed by me and considered in my medical decision making (see chart for details).     Patient has left otitis media.  Will treat with amoxicillin as this is safe with pregnancy.  Patient advised to follow-up if symptoms persist or worsen.  Also advised patient to notify her OB/GYN that she is currently on antibiotic.  Discussed return precautions.  Patient verbalized understanding and was agreeable with plan. Final Clinical Impressions(s) / UC Diagnoses   Final diagnoses:  Other non-recurrent acute nonsuppurative otitis media of left ear     Discharge Instructions      You have an ear infection which is being treated an antibiotic that is safe with pregnancy.  Please follow-up if symptoms persist or worsen.    ED Prescriptions     Medication Sig Dispense Auth. Provider   amoxicillin (AMOXIL) 875 MG tablet  (Status: Discontinued) Take 1 tablet (875 mg total) by mouth 2 (two) times daily for 7 days. 14 tablet [place], Brewer E, [place]   amoxicillin (AMOXIL) 875 MG tablet Take 1 tablet (875 mg total) by mouth 2 (two) times daily for 7 days. 14 tablet [place], Acie Fredrickson, [place]      PDMP not reviewed this encounter.   Gustavus Bryant, [place] 09/05/22 2001

## 2022-09-05 NOTE — ED Triage Notes (Signed)
Pt c/o bubbles in the left ear that started this morning.

## 2022-09-05 NOTE — Discharge Instructions (Signed)
You have an ear infection which is being treated an antibiotic that is safe with pregnancy.  Please follow-up if symptoms persist or worsen.

## 2022-09-07 DIAGNOSIS — H66002 Acute suppurative otitis media without spontaneous rupture of ear drum, left ear: Secondary | ICD-10-CM | POA: Diagnosis not present

## 2022-09-17 NOTE — L&D Delivery Note (Signed)
Delivery Note At 4:17 PM a viable and healthy female was delivered via  (Presentation: Left Occiput     ).  APGAR: 7, 9; weight pending .   Placenta status: manual extraction, intact. Cord: 3V   cord avulsion at the time of placental delivery  The patient pushed for approximately 20 minutes and delivered a vigorous female infant in the vertex LOA presentation with Apgar scores of 7 at 1 minute and 9 at 5 minutes.  Patient had been in anterior rim for some time.  Anterior cervical rim was reduced and pushing commenced.  With the first several contractions the anterior cervical rim reappeared and was reduced manually.  The patient delivered the infant's head to crowning.  At the tail end of the contraction the fetal head was delivered and restituted to left occiput anterior.  Due to lack of contractions with the attempt to deliver the anterior shoulder the patient was placed in [place] and suprapubic pressure was applied along with maternal pushing in the anterior and posterior shoulders delivered easily.  The infant was passed to the waiting maternal abdomen and bulb suctioned and stimulated.  After 1 minute delay, the cord was clamped and cut.  With the delivery of the placenta the umbilical cord avulsed high up near the cord insertion into the placenta requiring manual extraction.  The placenta was inspected following delivery.  During labor the patient was noted to have bleeding greater than would be expected for bloody show.  Possible partial placental abruption was suspected, however inspection of the placenta postpartum did not show evidence of this.  The cervix was also inspected for any evidence of lacerations. none were found.  EBL 44 cc.  All sponge, instrument, needle counts were correct.  Mom and baby are doing well following delivery.  Will administer 1 g of Ancef postpartum for manual extraction of the placenta.   Anesthesia: Epidural Episiotomy: None Lacerations:  None Suture Repair:  NA Est.  Blood Loss (mL):  44cc  Mom to postpartum.  Baby to Couplet care / Skin to Skin.  Waynard Reeds 01/08/2023, 4:33 PM

## 2022-09-29 NOTE — Patient Instructions (Incomplete)
Other atopic dermatitis Eczema is usually worse in the winter months and  hydrocortisone and mometasone 0.1% ointment did not help  No specific triggers noted. Continue proper skin care.  May use triamcinolone 0.5% ointment twice a day as daily to affected areas up to 3 weeks in a row; stop when clear and can restart as needed for flares. Do not use on the face, neck, armpits or groin area. Do not use more than 3 weeks in a row.  Start using a daily antihistamine to help with the itching - May use over the counter antihistamines such as Zyrtec (cetirizine), Claritin (loratadine), Allegra (fexofenadine), or Xyzal (levocetirizine) daily.   Mild intermittent asthma without complication Asthma since a young child and currently uses albuterol as needed for exercise-induced symptoms.   May use albuterol rescue inhaler 2 puffs or nebulizer every 4 to 6 hours as needed for shortness of breath, chest tightness, coughing, and wheezing. May use albuterol rescue inhaler 2 puffs 5 to 15 minutes prior to strenuous physical activities. Monitor frequency of use.      Allergy with anaphylaxis due to food, subsequent encounter Currently avoiding shellfish, peanuts, tree nuts, peas and eggs due to reaction after ingestion in the form of whole body pruritus and rash.  No prior epinephrine use.  2017 skin testing was positive to shellfish however negative to peanuts, tree nut, eggs and peas.   Continue to avoid shellfish, tree nuts, peanuts, peas and eggs. In case of an allergic reaction, give Benadryl 4 teaspoonfuls every 6 hours, and if life-threatening symptoms occur, inject with EpiPen 0.3 mg. Follow emergency action plan   Perennial and seasonal allergic rhinoconjunctivitis Mild perennial rhinoconjunctivitis symptoms and using Flonase and over-the-counter antihistamines as needed with some benefit.  2017 skin testing was positive to dust mites, cockroach, tree, grass, ragweed, weed, mold and cat. Continue  environmental control measures. May use over the counter antihistamines such as Zyrtec (cetirizine), Claritin (loratadine), Allegra (fexofenadine), or Xyzal (levocetirizine) daily as needed. Stop Flonase due to pregnancy Start Rhinocort nasal spray 1-2 sprays in each nostril once a day as needed for stuffy nose  Please let us know if this treatment plan is not working well for you.  Schedule a follow up appointment in 6-12 months or sooner

## 2022-10-01 ENCOUNTER — Ambulatory Visit: Payer: Medicaid Other | Admitting: Family

## 2022-10-24 DIAGNOSIS — Z3480 Encounter for supervision of other normal pregnancy, unspecified trimester: Secondary | ICD-10-CM | POA: Diagnosis not present

## 2022-11-09 DIAGNOSIS — Z23 Encounter for immunization: Secondary | ICD-10-CM | POA: Diagnosis not present

## 2022-11-22 DIAGNOSIS — Z8759 Personal history of other complications of pregnancy, childbirth and the puerperium: Secondary | ICD-10-CM | POA: Diagnosis not present

## 2022-12-17 ENCOUNTER — Inpatient Hospital Stay (HOSPITAL_COMMUNITY)
Admission: AD | Admit: 2022-12-17 | Discharge: 2022-12-17 | Disposition: A | Discharge status: Home / Self Care | Payer: Medicaid Other | Attending: Obstetrics and Gynecology | Admitting: Obstetrics and Gynecology

## 2022-12-17 ENCOUNTER — Encounter (HOSPITAL_COMMUNITY): Payer: Self-pay | Admitting: Obstetrics and Gynecology

## 2022-12-17 DIAGNOSIS — N939 Abnormal uterine and vaginal bleeding, unspecified: Secondary | ICD-10-CM | POA: Diagnosis present

## 2022-12-17 DIAGNOSIS — O23593 Infection of other part of genital tract in pregnancy, third trimester: Secondary | ICD-10-CM | POA: Diagnosis not present

## 2022-12-17 DIAGNOSIS — N760 Acute vaginitis: Secondary | ICD-10-CM

## 2022-12-17 DIAGNOSIS — Z0371 Encounter for suspected problem with amniotic cavity and membrane ruled out: Secondary | ICD-10-CM | POA: Diagnosis not present

## 2022-12-17 DIAGNOSIS — Z3A35 35 weeks gestation of pregnancy: Secondary | ICD-10-CM

## 2022-12-17 DIAGNOSIS — B9689 Other specified bacterial agents as the cause of diseases classified elsewhere: Secondary | ICD-10-CM | POA: Diagnosis not present

## 2022-12-17 LAB — WET PREP, GENITAL
Sperm: NONE SEEN
Trich, Wet Prep: NONE SEEN
WBC, Wet Prep HPF POC: >=10 — AB (ref <10)
Yeast Wet Prep HPF POC: NONE SEEN

## 2022-12-17 MED ORDER — METRONIDAZOLE 1 % EX GEL: Freq: Every day | CUTANEOUS | 0 refills | Status: DC

## 2022-12-17 NOTE — Discharge Instructions (Signed)

## 2022-12-17 NOTE — MAU Provider Note (Signed)
History     CSN: [id number]  Arrival date and time: 12/17/22 [id number]   Event Date/Time   First Provider Initiated Contact with Patient 12/17/22 0940      Chief Complaint  Patient presents with   Vaginal Bleeding   Rupture of Membranes   Abdominal Pain   HPI  Wendy Adams is a 22 y.o. G2P1001 at [redacted]w[redacted]d who presents for evaluation of vaginal bleeding. Patient reports she saw some spotting yesterday but felt like it was more when she wiped in the shower. She has not seen any blood since the. She reports intermittent cramping. Patient rates the pain as a 4/10 and has not tried anything for the pain. She denies any discharge and leaking of fluid. Denies any constipation, diarrhea or any urinary complaints. Reports normal fetal movement.   OB History     Gravida  2   Para  1   Term  1   Preterm      AB      Living  1      SAB      IAB      Ectopic      Multiple  0   Live Births  1           Past Medical History:  Diagnosis Date   Allergy    Angio-edema    Asthma    Eczema    Headache(784.0)    Multiple allergies     Past Surgical History:  Procedure Laterality Date   NO PAST SURGERIES      Family History  Problem Relation Age of Onset   Asthma Father    Eczema Father    Diabetes Maternal Grandmother    Lupus Maternal Grandmother    Diabetes Maternal Grandfather    Migraines Mother    Alcohol abuse Neg Hx    Arthritis Neg Hx    Birth defects Neg Hx    Cancer Neg Hx    COPD Neg Hx    Depression Neg Hx    Drug abuse Neg Hx    Early death Neg Hx    Hearing loss Neg Hx    Heart disease Neg Hx    Hyperlipidemia Neg Hx    Hypertension Neg Hx    Kidney disease Neg Hx    Learning disabilities Neg Hx    Mental illness Neg Hx    Mental retardation Neg Hx    Vision loss Neg Hx    Allergic rhinitis Neg Hx    Angioedema Neg Hx    Atopy Neg Hx    Immunodeficiency Neg Hx    Urticaria Neg Hx    Cystic fibrosis Neg Hx    Emphysema Neg Hx      Social History   Tobacco Use   Smoking status: Never   Smokeless tobacco: Never  Vaping Use   Vaping Use: Never used  Substance Use Topics   Alcohol use: No   Drug use: No    Allergies:  Allergies  Allergen Reactions   Food Anaphylaxis, Hives and Other (See Comments)    Pt is allergic to peanut butter and [place].    Peanut Oil Anaphylaxis   Beef-Derived Products Hives   Chicken Protein Hives   Egg-Derived Products Hives   Pork-Derived Products Hives   Shellfish Allergy Hives    No medications prior to admission.    Review of Systems  Constitutional: Negative.  Negative for fatigue and fever.  HENT: Negative.  Respiratory: Negative.  Negative for shortness of breath.   Cardiovascular: Negative.  Negative for chest pain.  Gastrointestinal: Negative.  Negative for abdominal pain, constipation, diarrhea, nausea and vomiting.  Genitourinary:  Positive for vaginal bleeding. Negative for dysuria and vaginal discharge.  Neurological: Negative.  Negative for dizziness and headaches.   Physical Exam   Blood pressure 123/80, pulse 88, temperature 98.2 F (36.8 C), temperature source Oral, resp. rate 18, height 5\' 3"  (1.6 m), weight 71.3 kg, last menstrual period 03/16/2022, SpO2 100 %.  Patient Vitals for the past 24 hrs:  BP Temp Temp src Pulse Resp SpO2 Height Weight  12/17/22 1015 123/80 -- -- 88 -- 100 % -- --  12/17/22 1000 125/79 -- -- 92 18 99 % -- --  12/17/22 0940 -- -- -- -- -- 100 % -- --  12/17/22 0931 (!) 127/91 -- -- 90 -- -- -- --  12/17/22 0916 118/86 98.2 F (36.8 C) Oral 89 16 100 % 5\' 3"  (1.6 m) 71.3 kg    Physical Exam Vitals and nursing note reviewed.  Constitutional:      General: She is not in acute distress.    Appearance: She is well-developed.  HENT:     Head: Normocephalic.  Eyes:     Pupils: Pupils are equal, round, and reactive to light.  Cardiovascular:     Rate and Rhythm: Normal rate and regular rhythm.     Heart sounds:  Normal heart sounds.  Pulmonary:     Effort: Pulmonary effort is normal. No respiratory distress.     Breath sounds: Normal breath sounds.  Abdominal:     General: Bowel sounds are normal. There is no distension.     Palpations: Abdomen is soft.     Tenderness: There is no abdominal tenderness.  Skin:    General: Skin is warm and dry.  Neurological:     Mental Status: She is alert and oriented to person, place, and time.  Psychiatric:        Mood and Affect: Mood normal.        Behavior: Behavior normal.        Thought Content: Thought content normal.        Judgment: Judgment normal.    Fetal Tracing:  Baseline: 130 Variability: moderate Accels: 15x15 Decels: none  Toco: irregular UC's  Dilation: Fingertip Exam by:: Haynes Bast, CNM   MAU Course  Procedures  Results for orders placed or performed during the hospital encounter of 12/17/22 (from the past 24 hour(s))  Wet prep, genital     Status: Abnormal   Collection Time: 12/17/22  9:57 AM   Specimen: Cervix  Result Value Ref Range   Yeast Wet Prep HPF POC NONE SEEN NONE SEEN   Trich, Wet Prep NONE SEEN NONE SEEN   Clue Cells Wet Prep HPF POC PRESENT (A) NONE SEEN   WBC, Wet Prep HPF POC >=10 (A) <10   Sperm NONE SEEN     MDM Prenatal records from community office not on file. Pregnancy uncomplicated by patient report Labs ordered and reviewed.   Wet prep and gc/chlamydia No blood on exam. No pooling or signs of rupture of membranes  Assessment and Plan   1. Encounter for suspected premature rupture of amniotic membranes, with rupture of membranes not found   2. Bacterial vaginosis   3. [redacted] weeks gestation of pregnancy     -Discharge home in stable condition -Rx for metrogel sent to pharmacy -Third trimester precautions discussed -  Patient advised to follow-up with OB as scheduled for prenatal care -Patient may return to MAU as needed or if her condition were to change or worsen  [name],  CNM 12/17/2022, 9:40 AM

## 2022-12-17 NOTE — MAU Note (Signed)
Wendy Adams is a 22 y.o. at [redacted]w[redacted]d here in MAU reporting: spotting yesterday, called OB was told if got worse to come in.  When showered this morning, washcloth had a lot of blood on it.  Also having some white d/c. Cramping off and on, mild.  Every night since Thursday, when she gets up to the bathroom during the night, her underwear is soaked. Not happening any other time.  Onset of complaint: Thu Pain score: mild Vitals:   12/17/22 0916  BP: 118/86  Pulse: 89  Resp: 16  Temp: 98.2 F (36.8 C)  SpO2: 100%     FHT:150 Lab orders placed from triage:

## 2022-12-18 LAB — GC/CHLAMYDIA PROBE AMP (~~LOC~~) NOT AT ARMC
Chlamydia: NEGATIVE
Comment: NEGATIVE
Comment: NORMAL
Neisseria Gonorrhea: NEGATIVE

## 2022-12-21 DIAGNOSIS — Z113 Encounter for screening for infections with a predominantly sexual mode of transmission: Secondary | ICD-10-CM | POA: Diagnosis not present

## 2022-12-21 DIAGNOSIS — Z3480 Encounter for supervision of other normal pregnancy, unspecified trimester: Secondary | ICD-10-CM | POA: Diagnosis not present

## 2022-12-21 LAB — OB RESULTS CONSOLE GBS: GBS: NEGATIVE

## 2023-01-06 ENCOUNTER — Inpatient Hospital Stay (EMERGENCY_DEPARTMENT_HOSPITAL)
Admission: AD | Admit: 2023-01-06 | Discharge: 2023-01-07 | Disposition: A | Discharge status: Home / Self Care | Payer: Medicaid Other | Source: Home / Self Care | Attending: Obstetrics and Gynecology | Admitting: Obstetrics and Gynecology

## 2023-01-06 DIAGNOSIS — R519 Headache, unspecified: Secondary | ICD-10-CM | POA: Insufficient documentation

## 2023-01-06 DIAGNOSIS — Z3A38 38 weeks gestation of pregnancy: Secondary | ICD-10-CM

## 2023-01-06 DIAGNOSIS — O479 False labor, unspecified: Secondary | ICD-10-CM

## 2023-01-06 DIAGNOSIS — O471 False labor at or after 37 completed weeks of gestation: Secondary | ICD-10-CM | POA: Insufficient documentation

## 2023-01-06 DIAGNOSIS — O26893 Other specified pregnancy related conditions, third trimester: Secondary | ICD-10-CM | POA: Insufficient documentation

## 2023-01-06 DIAGNOSIS — R030 Elevated blood-pressure reading, without diagnosis of hypertension: Secondary | ICD-10-CM

## 2023-01-06 NOTE — MAU Note (Signed)
.  Wendy Adams is a 22 y.o. at [redacted]w[redacted]d here in MAU reporting: Headache when she got to MAU. Denies visual changes or epigastric pain.   Contractions: Irregular Onset of ctx: Today 7pm Pain score: 5/10  ROM: Intact Vaginal Bleeding: None Last SVE: 1.5 cm   Fetal Movement: Reports positive FM FHT:133 via External  Vitals:   01/06/23 2307  BP: (!) 131/91  Pulse: 86  Resp: 17  Temp: 98 F (36.7 C)  SpO2: 100%        OB Office: [facility] GBS: Negative HSV: Denies hx of HSV

## 2023-01-06 NOTE — MAU Provider Note (Signed)
History     CSN: [id number]  Arrival date and time: 01/06/23 2236   Event Date/Time   First Provider Initiated Contact with Patient 01/06/23 2340      Chief Complaint  Patient presents with   Contractions   Wendy Adams , a  22 y.o. G2P1001 at [redacted]w[redacted]d presents to MAU with complaints of contractions that started yesterday. She reports that today the pain became worse and stopped her from eating dinner. She states she is feeling contractions every 15-20 mins and they are intermittent, She describes them as "tightening". Currently rating pain a 5/10. She denies attempting to relieve symptoms. She denies abnormal vaginal discharge, vaginal bleeding or leaking of fluid. She endorses positive fetal movement.   She also notes a headache that started on the way to MAU. She states she believes its more from not eating and not resting. She denies visual changes and epigastric pain. She also denies new onset of swelling,   Upon arrival to MAU patient had 2 elevated BPs.          OB History     Gravida  2   Para  1   Term  1   Preterm      AB      Living  1      SAB      IAB      Ectopic      Multiple  0   Live Births  1           Past Medical History:  Diagnosis Date   Allergy    Angio-edema    Asthma    Eczema    Headache(784.0)    Multiple allergies     Past Surgical History:  Procedure Laterality Date   NO PAST SURGERIES      Family History  Problem Relation Age of Onset   Asthma Father    Eczema Father    Diabetes Maternal Grandmother    Lupus Maternal Grandmother    Diabetes Maternal Grandfather    Migraines Mother    Alcohol abuse Neg Hx    Arthritis Neg Hx    Birth defects Neg Hx    Cancer Neg Hx    COPD Neg Hx    Depression Neg Hx    Drug abuse Neg Hx    Early death Neg Hx    Hearing loss Neg Hx    Heart disease Neg Hx    Hyperlipidemia Neg Hx    Hypertension Neg Hx    Kidney disease Neg Hx    Learning disabilities Neg Hx     Mental illness Neg Hx    Mental retardation Neg Hx    Vision loss Neg Hx    Allergic rhinitis Neg Hx    Angioedema Neg Hx    Atopy Neg Hx    Immunodeficiency Neg Hx    Urticaria Neg Hx    Cystic fibrosis Neg Hx    Emphysema Neg Hx     Social History   Tobacco Use   Smoking status: Never   Smokeless tobacco: Never  Vaping Use   Vaping Use: Never used  Substance Use Topics   Alcohol use: No   Drug use: No    Allergies:  Allergies  Allergen Reactions   Food Anaphylaxis, Hives and Other (See Comments)    Pt is allergic to peanut butter and [place].    Peanut Oil Anaphylaxis   Beef-Derived Products Hives   Chicken Protein Hives  Egg-Derived Products Hives   Pork-Derived Products Hives   Shellfish Allergy Hives    Medications Prior to Admission  Medication Sig Dispense Refill Last Dose   albuterol (VENTOLIN HFA) 108 (90 Base) MCG/ACT inhaler Inhale 2 puffs every 4-6 hours as needed for cough, wheeze, tightness in chest, or shortness of breath. 18 g 1    Cetirizine HCl 10 MG CAPS Take by mouth as needed.      EPINEPHrine 0.3 mg/0.3 mL IJ SOAJ injection Inject 0.3 mg into the muscle as needed for anaphylaxis. 1 each 2    fluticasone (FLONASE) 50 MCG/ACT nasal spray Place into the nose as needed.      metroNIDAZOLE (METROGEL) 1 % gel Apply topically daily. 45 g 0    montelukast (SINGULAIR) 10 MG tablet Take by mouth as needed.      Prenatal MV & Min w/FA-DHA (PRENATAL ADULT GUMMY/DHA/FA) 0.4-25 MG CHEW Chew by mouth daily.       Review of Systems  Constitutional:  Negative for chills, fatigue and fever.  Eyes:  Negative for pain and visual disturbance.  Respiratory:  Negative for apnea, shortness of breath and wheezing.   Cardiovascular:  Negative for chest pain and palpitations.  Gastrointestinal:  Positive for abdominal pain. Negative for constipation, diarrhea, nausea and vomiting.  Genitourinary:  Positive for vaginal pain. Negative for difficulty urinating, dysuria,  pelvic pain, vaginal bleeding and vaginal discharge.  Musculoskeletal:  Negative for back pain.  Neurological:  Positive for headaches. Negative for seizures and weakness.  Psychiatric/Behavioral:  Negative for suicidal ideas.    Physical Exam   Blood pressure 126/89, pulse 72, temperature 98 F (36.7 C), temperature source Oral, resp. rate 17, height  (1.6 m), weight 72 kg, last menstrual period 03/16/2022, SpO2 100 %.  Physical Exam Vitals and nursing note reviewed.  Constitutional:      General: She is not in acute distress.    Appearance: Normal appearance.  HENT:     Head: Normocephalic.  Cardiovascular:     Rate and Rhythm: Normal rate.  Pulmonary:     Effort: Pulmonary effort is normal.  Abdominal:     Palpations: Abdomen is soft.     Comments: Pregnant; contractions mild to palpation.   Musculoskeletal:     Cervical back: Normal range of motion.  Skin:    General: Skin is warm and dry.  Neurological:     Mental Status: She is alert and oriented to person, place, and time.  Psychiatric:        Mood and Affect: Mood normal.    FHT: 135 bpm with moderate variability. 15x15 accels noted. No decles observed.  - TOco: irregular contractions 7-10 mins.   Dilation: 1 Effacement (%): Thick Exam by:: A Twitty RN   MAU Course  Procedures Orders Placed This Encounter  Procedures   CBC with Differential/Platelet   Comprehensive metabolic panel   Protein / creatinine ratio, urine   Discharge patient   Results for orders placed or performed during the hospital encounter of 01/06/23 (from the past 24 hour(s))  Protein / creatinine ratio, urine     Status: None   Collection Time: 01/06/23 11:39 PM  Result Value Ref Range   Creatinine, Urine 53 mg/dL   Total Protein, Urine <6 mg/dL   Protein Creatinine Ratio        0.00 - 0.15 mg/mg[Cre]  CBC with Differential/Platelet     Status: Abnormal   Collection Time: 01/07/23 12:30 AM  Result Value Ref Range  WBC 7.7  4.0 - 10.5 K/uL   RBC 3.60 (L) 3.87 - 5.11 MIL/uL   Hemoglobin 9.6 (L) 12.0 - 15.0 g/dL   HCT 40.9 (L) 81.1 - 91.4 %   MCV 82.8 80.0 - 100.0 fL   MCH 26.7 26.0 - 34.0 pg   MCHC 32.2 30.0 - 36.0 g/dL   RDW 78.2 95.6 - 21.3 %   Platelets 255 150 - 400 K/uL   nRBC 0.0 0.0 - 0.2 %   Neutrophils Relative % 50 %   Neutro Abs 3.9 1.7 - 7.7 K/uL   Lymphocytes Relative 37 %   Lymphs Abs 2.8 0.7 - 4.0 K/uL   Monocytes Relative 9 %   Monocytes Absolute 0.7 0.1 - 1.0 K/uL   Eosinophils Relative 3 %   Eosinophils Absolute 0.2 0.0 - 0.5 K/uL   Basophils Relative 1 %   Basophils Absolute 0.1 0.0 - 0.1 K/uL   Immature Granulocytes 0 %   Abs Immature Granulocytes 0.03 0.00 - 0.07 K/uL  Comprehensive metabolic panel     Status: Abnormal   Collection Time: 01/07/23 12:30 AM  Result Value Ref Range   Sodium 133 (L) 135 - 145 mmol/L   Potassium 3.8 3.5 - 5.1 mmol/L   Chloride 105 98 - 111 mmol/L   CO2 20 (L) 22 - 32 mmol/L   Glucose, Bld 77 70 - 99 mg/dL   BUN 9 6 - 20 mg/dL   Creatinine, Ser 0.86 0.44 - 1.00 mg/dL   Calcium 8.5 (L) 8.9 - 10.3 mg/dL   Total Protein 6.2 (L) 6.5 - 8.1 g/dL   Albumin 2.8 (L) 3.5 - 5.0 g/dL   AST 19 15 - 41 U/L   ALT 10 0 - 44 U/L   Alkaline Phosphatase 169 (H) 38 - 126 U/L   Total Bilirubin 0.5 0.3 - 1.2 mg/dL   GFR, Estimated >57 >84 mL/min   Anion gap 8 5 - 15   Patient Vitals for the past 24 hrs:  BP Temp Temp src Pulse Resp SpO2 Height Weight  01/07/23 0115 126/89 -- -- 72 -- -- -- --  01/07/23 0100 (!) 145/92 -- -- 67 -- -- -- --  01/07/23 0030 (!) 153/99 -- -- 62 -- -- -- --  01/07/23 0015 (!) 139/98 -- -- 74 -- -- -- --  01/07/23 0005 (!) 147/97 -- -- 68 -- -- -- --  01/06/23 2336 (!) 158/101 -- -- 71 -- -- -- --  01/06/23 2307 (!) 131/91 98 F (36.7 C) Oral 86 17 100 %  (1.6 m) 72 kg    MDM - Headache improved spontaneously with crackers.  - Pre E labs normal, low suspicion for preterm labor.  -. Cervix unchanged. Low suspicion for  labor.  - Elevated BPs for majority of MAU visit.  - FHT Cat I  - Patient desires to go home.  - Consulted Dr. Reina Fuse on patient. Reviewed patient presentation and current clinical picture. Discussed normal labs and patient desire to go home. MD okay with discharge. Patient has appointment on Thursday. MD to notify office to get patient an earlier appointment and BP check.  - plan for discharge    Assessment and Plan   1. False labor   2. Pregnancy headache in third trimester   3. [redacted] weeks gestation of pregnancy   4. Elevated BP without diagnosis of hypertension    - Reviewed elevated BPs in MAU.  - Worsening signs and Pre E precautions reviewed.  -  Signs and symptoms of labor discussed.  - Patient has appointment on Thursday, notified of plan of earlier visit.  - FHT appropriate for gestational age upon time of discharge.  - Patient discharged home in stable condition and may return to MAU as needed.   Claudette Head, MSN CNM  01/07/2023, 2:10 AM

## 2023-01-07 ENCOUNTER — Encounter (HOSPITAL_COMMUNITY): Payer: Self-pay | Admitting: Obstetrics and Gynecology

## 2023-01-07 ENCOUNTER — Inpatient Hospital Stay (HOSPITAL_COMMUNITY)
Admission: AD | Admit: 2023-01-07 | Discharge: 2023-01-10 | DRG: 807 | Disposition: A | Discharge status: Home / Self Care | Payer: Medicaid Other | Attending: Obstetrics and Gynecology | Admitting: Obstetrics and Gynecology

## 2023-01-07 DIAGNOSIS — O479 False labor, unspecified: Secondary | ICD-10-CM | POA: Diagnosis not present

## 2023-01-07 DIAGNOSIS — O133 Gestational [pregnancy-induced] hypertension without significant proteinuria, third trimester: Secondary | ICD-10-CM | POA: Diagnosis not present

## 2023-01-07 DIAGNOSIS — O26893 Other specified pregnancy related conditions, third trimester: Secondary | ICD-10-CM | POA: Diagnosis not present

## 2023-01-07 DIAGNOSIS — R030 Elevated blood-pressure reading, without diagnosis of hypertension: Secondary | ICD-10-CM

## 2023-01-07 DIAGNOSIS — Z23 Encounter for immunization: Secondary | ICD-10-CM

## 2023-01-07 DIAGNOSIS — Z3A38 38 weeks gestation of pregnancy: Secondary | ICD-10-CM

## 2023-01-07 DIAGNOSIS — R519 Headache, unspecified: Secondary | ICD-10-CM | POA: Diagnosis not present

## 2023-01-07 DIAGNOSIS — O139 Gestational [pregnancy-induced] hypertension without significant proteinuria, unspecified trimester: Principal | ICD-10-CM | POA: Diagnosis present

## 2023-01-07 DIAGNOSIS — O134 Gestational [pregnancy-induced] hypertension without significant proteinuria, complicating childbirth: Principal | ICD-10-CM | POA: Diagnosis present

## 2023-01-07 LAB — CBC WITH DIFFERENTIAL/PLATELET
Abs Immature Granulocytes: 0.03 10*3/uL (ref 0.00–0.07)
Basophils Absolute: 0.1 10*3/uL (ref 0.0–0.1)
Basophils Relative: 1 %
Eosinophils Absolute: 0.2 10*3/uL (ref 0.0–0.5)
Eosinophils Relative: 3 %
HCT: 29.8 % — ABNORMAL LOW (ref 36.0–46.0)
Hemoglobin: 9.6 g/dL — ABNORMAL LOW (ref 12.0–15.0)
Immature Granulocytes: 0 %
Lymphocytes Relative: 37 %
Lymphs Abs: 2.8 10*3/uL (ref 0.7–4.0)
MCH: 26.7 pg (ref 26.0–34.0)
MCHC: 32.2 g/dL (ref 30.0–36.0)
MCV: 82.8 fL (ref 80.0–100.0)
Monocytes Absolute: 0.7 10*3/uL (ref 0.1–1.0)
Monocytes Relative: 9 %
Neutro Abs: 3.9 10*3/uL (ref 1.7–7.7)
Neutrophils Relative %: 50 %
Platelets: 255 10*3/uL (ref 150–400)
RBC: 3.6 MIL/uL — ABNORMAL LOW (ref 3.87–5.11)
RDW: 14.3 % (ref 11.5–15.5)
WBC: 7.7 10*3/uL (ref 4.0–10.5)
nRBC: 0 % (ref 0.0–0.2)

## 2023-01-07 LAB — COMPREHENSIVE METABOLIC PANEL
ALT: 10 U/L (ref 0–44)
ALT: 11 U/L (ref 0–44)
AST: 19 U/L (ref 15–41)
AST: 20 U/L (ref 15–41)
Albumin: 2.8 g/dL — ABNORMAL LOW (ref 3.5–5.0)
Albumin: 2.9 g/dL — ABNORMAL LOW (ref 3.5–5.0)
Alkaline Phosphatase: 169 U/L — ABNORMAL HIGH (ref 38–126)
Alkaline Phosphatase: 194 U/L — ABNORMAL HIGH (ref 38–126)
Anion gap: 8 (ref 5–15)
Anion gap: 9 (ref 5–15)
BUN: 9 mg/dL (ref 6–20)
BUN: 10 mg/dL (ref 6–20)
CO2: 20 mmol/L — ABNORMAL LOW (ref 22–32)
CO2: 20 mmol/L — ABNORMAL LOW (ref 22–32)
Calcium: 8.5 mg/dL — ABNORMAL LOW (ref 8.9–10.3)
Calcium: 8.3 mg/dL — ABNORMAL LOW (ref 8.9–10.3)
Chloride: 105 mmol/L (ref 98–111)
Chloride: 105 mmol/L (ref 98–111)
Creatinine, Ser: 0.66 mg/dL (ref 0.44–1.00)
Creatinine, Ser: 0.74 mg/dL (ref 0.44–1.00)
GFR, Estimated: >60 mL/min (ref >60)
GFR, Estimated: >60 mL/min (ref >60)
Glucose, Bld: 77 mg/dL (ref 70–99)
Glucose, Bld: 87 mg/dL (ref 70–99)
Potassium: 3.8 mmol/L (ref 3.5–5.1)
Potassium: 3.6 mmol/L (ref 3.5–5.1)
Sodium: 133 mmol/L — ABNORMAL LOW (ref 135–145)
Sodium: 134 mmol/L — ABNORMAL LOW (ref 135–145)
Total Bilirubin: 0.5 mg/dL (ref 0.3–1.2)
Total Bilirubin: 0.6 mg/dL (ref 0.3–1.2)
Total Protein: 6.2 g/dL — ABNORMAL LOW (ref 6.5–8.1)
Total Protein: 6.4 g/dL — ABNORMAL LOW (ref 6.5–8.1)

## 2023-01-07 LAB — URINALYSIS, ROUTINE W REFLEX MICROSCOPIC
Bilirubin Urine: NEGATIVE
Glucose, UA: NEGATIVE mg/dL
Hgb urine dipstick: NEGATIVE
Ketones, ur: NEGATIVE mg/dL
Nitrite: NEGATIVE
Protein, ur: 30 mg/dL — AB
Specific Gravity, Urine: 1.026 (ref 1.005–1.030)
pH: 5 (ref 5.0–8.0)

## 2023-01-07 LAB — PROTEIN / CREATININE RATIO, URINE
Creatinine, Urine: 53 mg/dL
Creatinine, Urine: 314 mg/dL
Protein Creatinine Ratio: 0.14 mg/mg{Cre} (ref 0.00–0.15)
Total Protein, Urine: <6 mg/dL
Total Protein, Urine: 45 mg/dL

## 2023-01-07 LAB — TYPE AND SCREEN
ABO/RH(D): O POS
Antibody Screen: NEGATIVE

## 2023-01-07 LAB — CBC
HCT: 29.7 % — ABNORMAL LOW (ref 36.0–46.0)
Hemoglobin: 9.9 g/dL — ABNORMAL LOW (ref 12.0–15.0)
MCH: 27.2 pg (ref 26.0–34.0)
MCHC: 33.3 g/dL (ref 30.0–36.0)
MCV: 81.6 fL (ref 80.0–100.0)
Platelets: 253 10*3/uL (ref 150–400)
RBC: 3.64 MIL/uL — ABNORMAL LOW (ref 3.87–5.11)
RDW: 14.3 % (ref 11.5–15.5)
WBC: 8.8 10*3/uL (ref 4.0–10.5)
nRBC: 0 % (ref 0.0–0.2)

## 2023-01-07 MED ORDER — MISOPROSTOL 25 MCG QUARTER TABLET
25.0000 ug | ORAL_TABLET | ORAL | Status: DC | PRN
  Administered 2023-01-07: 25 ug via VAGINAL
  Filled 2023-01-07: qty 1

## 2023-01-07 MED ORDER — LACTATED RINGERS IV SOLN
500.0000 mL | INTRAVENOUS | Status: DC | PRN
  Administered 2023-01-08: 1000 mL via INTRAVENOUS

## 2023-01-07 MED ORDER — ONDANSETRON HCL 4 MG/2ML IJ SOLN
4.0000 mg | Freq: Four times a day (QID) | INTRAMUSCULAR | Status: DC | PRN
  Administered 2023-01-08: 4 mg via INTRAVENOUS
  Filled 2023-01-07: qty 2

## 2023-01-07 MED ORDER — OXYCODONE-ACETAMINOPHEN 5-325 MG PO TABS: 2.0000 | ORAL_TABLET | ORAL | Status: DC | PRN

## 2023-01-07 MED ORDER — LACTATED RINGERS IV SOLN: INTRAVENOUS | Status: DC

## 2023-01-07 MED ORDER — SOD CITRATE-CITRIC ACID 500-334 MG/5ML PO SOLN: 30.0000 mL | ORAL | Status: DC | PRN

## 2023-01-07 MED ORDER — LIDOCAINE HCL (PF) 1 % IJ SOLN: 30.0000 mL | INTRAMUSCULAR | Status: DC | PRN

## 2023-01-07 MED ORDER — TERBUTALINE SULFATE 1 MG/ML IJ SOLN: 0.2500 mg | Freq: Once | INTRAMUSCULAR | Status: DC | PRN

## 2023-01-07 MED ORDER — OXYTOCIN-SODIUM CHLORIDE 30-0.9 UT/500ML-% IV SOLN
2.5000 [IU]/h | INTRAVENOUS | Status: DC
  Filled 2023-01-07: qty 500

## 2023-01-07 MED ORDER — OXYCODONE-ACETAMINOPHEN 5-325 MG PO TABS: 1.0000 | ORAL_TABLET | ORAL | Status: DC | PRN

## 2023-01-07 MED ORDER — OXYTOCIN BOLUS FROM INFUSION
333.0000 mL | Freq: Once | INTRAVENOUS | Status: AC
  Administered 2023-01-08: 333 mL via INTRAVENOUS

## 2023-01-07 MED ORDER — ACETAMINOPHEN 325 MG PO TABS
650.0000 mg | ORAL_TABLET | ORAL | Status: DC | PRN
  Administered 2023-01-08: 650 mg via ORAL
  Filled 2023-01-07: qty 2

## 2023-01-07 NOTE — MAU Provider Note (Signed)
History     CSN: [id number]  Arrival date and time: 01/07/23 1838   None     Chief Complaint  Patient presents with   Hypertension   HPI This is a 22 year old G2 P1-0-0-1 at 38 weeks and 3 days presented last night, labor check was found to have elevated blood pressures that were consistently in the 140s 150s over 90s.  She was discharged to home.  Her primary OB spoke with her and had her come into the office.  Her breath pressure was still elevated so she was sent here for evaluation.  She denies chest pain, abdominal pain, blurred vision, headache.  She has good fetal movement.  OB History     Gravida  2   Para  1   Term  1   Preterm      AB      Living  1      SAB      IAB      Ectopic      Multiple  0   Live Births  1           Past Medical History:  Diagnosis Date   Allergy    Angio-edema    Asthma    Eczema    Headache(784.0)    Multiple allergies     Past Surgical History:  Procedure Laterality Date   NO PAST SURGERIES      Family History  Problem Relation Age of Onset   Migraines Mother    Asthma Father    Eczema Father    Diabetes Maternal Grandmother    Lupus Maternal Grandmother    Diabetes Maternal Grandfather    Alcohol abuse Neg Hx    Arthritis Neg Hx    Birth defects Neg Hx    Cancer Neg Hx    COPD Neg Hx    Depression Neg Hx    Drug abuse Neg Hx    Early death Neg Hx    Hearing loss Neg Hx    Heart disease Neg Hx    Hyperlipidemia Neg Hx    Hypertension Neg Hx    Kidney disease Neg Hx    Learning disabilities Neg Hx    Mental illness Neg Hx    Mental retardation Neg Hx    Vision loss Neg Hx    Allergic rhinitis Neg Hx    Angioedema Neg Hx    Atopy Neg Hx    Immunodeficiency Neg Hx    Urticaria Neg Hx    Cystic fibrosis Neg Hx    Emphysema Neg Hx     Social History   Tobacco Use   Smoking status: Never   Smokeless tobacco: Never  Vaping Use   Vaping Use: Never used  Substance Use Topics   Alcohol  use: No   Drug use: No    Allergies:  Allergies  Allergen Reactions   Food Anaphylaxis, Hives and Other (See Comments)    Pt is allergic to peanut butter and [place].    Peanut Oil Anaphylaxis   Beef-Derived Products Hives   Chicken Protein Hives   Egg-Derived Products Hives   Pork-Derived Products Hives   Shellfish Allergy Hives    Medications Prior to Admission  Medication Sig Dispense Refill Last Dose   fluticasone (FLONASE) 50 MCG/ACT nasal spray Place into the nose as needed.   01/07/2023   metroNIDAZOLE (METROGEL) 1 % gel Apply topically daily. 45 g 0 Past Month   Prenatal MV & Min w/FA-DHA (  PRENATAL ADULT GUMMY/DHA/FA) 0.4-25 MG CHEW Chew by mouth daily.   01/07/2023   albuterol (VENTOLIN HFA) 108 (90 Base) MCG/ACT inhaler Inhale 2 puffs every 4-6 hours as needed for cough, wheeze, tightness in chest, or shortness of breath. 18 g 1 More than a month   Cetirizine HCl 10 MG CAPS Take by mouth as needed.   More than a month   EPINEPHrine 0.3 mg/0.3 mL IJ SOAJ injection Inject 0.3 mg into the muscle as needed for anaphylaxis. 1 each 2 More than a month   montelukast (SINGULAIR) 10 MG tablet Take by mouth as needed.   More than a month    Review of Systems Physical Exam   Blood pressure 127/83, pulse 95, temperature 98.1 F (36.7 C), resp. rate 18, height  (1.6 m), weight 71.7 kg, last menstrual period 03/16/2022.  Physical Exam Vitals and nursing note reviewed.  Constitutional:      Appearance: Normal appearance.  Cardiovascular:     Rate and Rhythm: Normal rate and regular rhythm.  Pulmonary:     Effort: Pulmonary effort is normal.     Breath sounds: Normal breath sounds.  Abdominal:     General: Abdomen is flat.     Palpations: Abdomen is soft.  Skin:    General: Skin is warm and dry.     Capillary Refill: Capillary refill takes less than 2 seconds.  Neurological:     General: No focal deficit present.     Mental Status: She is alert.  Psychiatric:         Mood and Affect: Mood normal.        Behavior: Behavior normal.        Thought Content: Thought content normal.        Judgment: Judgment normal.     MAU Course  Procedures NST:  Baseline: 130  Variability: moderate Accelerations: present  Decelerations: none Contractions: occasional  MDM  Blood pressure elevated. Check CBC, CMP, urine protein creatinine ratio, type and screen.  Start IV. Labs from last night reviewed: All of these were normal.  Spoke with Dr. Timothy Lasso.  She agrees for admission.   Assessment and Plan  Admission as above. Reactive NST.  Wendy Adams 01/07/2023, 7:29 PM
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
## 2023-01-08 ENCOUNTER — Other Ambulatory Visit: Payer: Self-pay

## 2023-01-08 ENCOUNTER — Inpatient Hospital Stay (HOSPITAL_COMMUNITY): Payer: Medicaid Other | Admitting: Anesthesiology

## 2023-01-08 ENCOUNTER — Encounter (HOSPITAL_COMMUNITY): Payer: Self-pay

## 2023-01-08 DIAGNOSIS — O164 Unspecified maternal hypertension, complicating childbirth: Secondary | ICD-10-CM | POA: Diagnosis not present

## 2023-01-08 DIAGNOSIS — J45909 Unspecified asthma, uncomplicated: Secondary | ICD-10-CM | POA: Diagnosis not present

## 2023-01-08 DIAGNOSIS — O139 Gestational [pregnancy-induced] hypertension without significant proteinuria, unspecified trimester: Secondary | ICD-10-CM | POA: Diagnosis not present

## 2023-01-08 DIAGNOSIS — O9952 Diseases of the respiratory system complicating childbirth: Secondary | ICD-10-CM | POA: Diagnosis not present

## 2023-01-08 DIAGNOSIS — Z3A38 38 weeks gestation of pregnancy: Secondary | ICD-10-CM | POA: Diagnosis not present

## 2023-01-08 LAB — RPR: RPR Ser Ql: NONREACTIVE

## 2023-01-08 MED ORDER — WITCH HAZEL-GLYCERIN EX PADS: 1.0000 | MEDICATED_PAD | CUTANEOUS | Status: DC | PRN

## 2023-01-08 MED ORDER — PHENYLEPHRINE 80 MCG/ML (10ML) SYRINGE FOR IV PUSH (FOR BLOOD PRESSURE SUPPORT)
80.0000 ug | PREFILLED_SYRINGE | INTRAVENOUS | Status: DC | PRN
  Filled 2023-01-08: qty 10

## 2023-01-08 MED ORDER — OXYCODONE HCL 5 MG PO TABS: 10.0000 mg | ORAL_TABLET | ORAL | Status: DC | PRN

## 2023-01-08 MED ORDER — CEFAZOLIN SODIUM-DEXTROSE 1-4 GM/50ML-% IV SOLN
1.0000 g | Freq: Three times a day (TID) | INTRAVENOUS | Status: AC
  Administered 2023-01-08: 1 g via INTRAVENOUS
  Filled 2023-01-08: qty 50

## 2023-01-08 MED ORDER — BENZOCAINE-MENTHOL 20-0.5 % EX AERO
1.0000 | INHALATION_SPRAY | CUTANEOUS | Status: DC | PRN
  Administered 2023-01-08: 1 via TOPICAL
  Filled 2023-01-08: qty 56

## 2023-01-08 MED ORDER — OXYTOCIN-SODIUM CHLORIDE 30-0.9 UT/500ML-% IV SOLN
1.0000 m[IU]/min | INTRAVENOUS | Status: DC
  Administered 2023-01-08: 2 m[IU]/min via INTRAVENOUS

## 2023-01-08 MED ORDER — LACTATED RINGERS IV SOLN: 500.0000 mL | Freq: Once | INTRAVENOUS | Status: DC

## 2023-01-08 MED ORDER — DIBUCAINE (PERIANAL) 1 % EX OINT: 1.0000 | TOPICAL_OINTMENT | CUTANEOUS | Status: DC | PRN

## 2023-01-08 MED ORDER — ONDANSETRON HCL 4 MG/2ML IJ SOLN: 4.0000 mg | INTRAMUSCULAR | Status: DC | PRN

## 2023-01-08 MED ORDER — EPHEDRINE 5 MG/ML INJ: 10.0000 mg | INTRAVENOUS | Status: DC | PRN

## 2023-01-08 MED ORDER — ZOLPIDEM TARTRATE 5 MG PO TABS: 5.0000 mg | ORAL_TABLET | Freq: Every evening | ORAL | Status: DC | PRN

## 2023-01-08 MED ORDER — COCONUT OIL OIL: 1.0000 | TOPICAL_OIL | Status: DC | PRN

## 2023-01-08 MED ORDER — PHENYLEPHRINE 80 MCG/ML (10ML) SYRINGE FOR IV PUSH (FOR BLOOD PRESSURE SUPPORT): 80.0000 ug | PREFILLED_SYRINGE | INTRAVENOUS | Status: DC | PRN

## 2023-01-08 MED ORDER — DIPHENHYDRAMINE HCL 25 MG PO CAPS: 25.0000 mg | ORAL_CAPSULE | Freq: Four times a day (QID) | ORAL | Status: DC | PRN

## 2023-01-08 MED ORDER — HYDRALAZINE HCL 20 MG/ML IJ SOLN
5.0000 mg | INTRAMUSCULAR | Status: DC | PRN
  Filled 2023-01-08: qty 1

## 2023-01-08 MED ORDER — OXYCODONE HCL 5 MG PO TABS: 5.0000 mg | ORAL_TABLET | ORAL | Status: DC | PRN

## 2023-01-08 MED ORDER — LACTATED RINGERS IV SOLN: INTRAVENOUS | Status: DC

## 2023-01-08 MED ORDER — LABETALOL HCL 5 MG/ML IV SOLN: 40.0000 mg | INTRAVENOUS | Status: DC | PRN

## 2023-01-08 MED ORDER — OXYTOCIN-SODIUM CHLORIDE 30-0.9 UT/500ML-% IV SOLN: 2.5000 [IU]/h | INTRAVENOUS | Status: DC | PRN

## 2023-01-08 MED ORDER — TERBUTALINE SULFATE 1 MG/ML IJ SOLN: 0.2500 mg | Freq: Once | INTRAMUSCULAR | Status: DC | PRN

## 2023-01-08 MED ORDER — FENTANYL-BUPIVACAINE-NACL 0.5-0.125-0.9 MG/250ML-% EP SOLN
12.0000 mL/h | EPIDURAL | Status: DC | PRN
  Administered 2023-01-08: 12 mL/h via EPIDURAL
  Filled 2023-01-08: qty 250

## 2023-01-08 MED ORDER — METHYLERGONOVINE MALEATE 0.2 MG/ML IJ SOLN: 0.2000 mg | INTRAMUSCULAR | Status: DC | PRN

## 2023-01-08 MED ORDER — METHYLERGONOVINE MALEATE 0.2 MG PO TABS: 0.2000 mg | ORAL_TABLET | ORAL | Status: DC | PRN

## 2023-01-08 MED ORDER — PRENATAL MULTIVITAMIN CH
1.0000 | ORAL_TABLET | Freq: Every day | ORAL | Status: DC
  Administered 2023-01-09: 1 via ORAL
  Filled 2023-01-08: qty 1

## 2023-01-08 MED ORDER — ACETAMINOPHEN 325 MG PO TABS: 650.0000 mg | ORAL_TABLET | ORAL | Status: DC | PRN

## 2023-01-08 MED ORDER — TETANUS-DIPHTH-ACELL PERTUSSIS 5-2.5-18.5 LF-MCG/0.5 IM SUSY: 0.5000 mL | PREFILLED_SYRINGE | Freq: Once | INTRAMUSCULAR | Status: DC

## 2023-01-08 MED ORDER — DIPHENHYDRAMINE HCL 50 MG/ML IJ SOLN: 12.5000 mg | INTRAMUSCULAR | Status: DC | PRN

## 2023-01-08 MED ORDER — LIDOCAINE HCL (PF) 1 % IJ SOLN
INTRAMUSCULAR | Status: DC | PRN
  Administered 2023-01-08: 11 mL via EPIDURAL

## 2023-01-08 MED ORDER — HYDRALAZINE HCL 20 MG/ML IJ SOLN: 10.0000 mg | INTRAMUSCULAR | Status: DC | PRN

## 2023-01-08 MED ORDER — SIMETHICONE 80 MG PO CHEW: 80.0000 mg | CHEWABLE_TABLET | ORAL | Status: DC | PRN

## 2023-01-08 MED ORDER — ONDANSETRON HCL 4 MG PO TABS: 4.0000 mg | ORAL_TABLET | ORAL | Status: DC | PRN

## 2023-01-08 MED ORDER — LABETALOL HCL 5 MG/ML IV SOLN: 20.0000 mg | INTRAVENOUS | Status: DC | PRN

## 2023-01-08 MED ORDER — SENNOSIDES-DOCUSATE SODIUM 8.6-50 MG PO TABS
2.0000 | ORAL_TABLET | Freq: Every day | ORAL | Status: DC
  Administered 2023-01-09 – 2023-01-10 (×2): 2 via ORAL
  Filled 2023-01-08 (×2): qty 2

## 2023-01-08 MED ORDER — IBUPROFEN 600 MG PO TABS
600.0000 mg | ORAL_TABLET | Freq: Four times a day (QID) | ORAL | Status: DC
  Administered 2023-01-08 – 2023-01-10 (×7): 600 mg via ORAL
  Filled 2023-01-08 (×7): qty 1

## 2023-01-08 MED ORDER — NIFEDIPINE ER OSMOTIC RELEASE 30 MG PO TB24
30.0000 mg | ORAL_TABLET | Freq: Every day | ORAL | Status: DC
  Administered 2023-01-08 – 2023-01-10 (×3): 30 mg via ORAL
  Filled 2023-01-08 (×3): qty 1

## 2023-01-08 NOTE — Progress Notes (Signed)
Patient ID: Wendy Adams, female   DOB: [birth year], 22 y.o.   MRN: [id number]  S: Comfortable with epidural. O: Afebrile vital signs stable. AOx3, NAD FHR 130s reactive, cat 1 tracing Toco Q2-4.  Attempted amniotomy, no significant fluid return. Bleeding slightly more than would be expected for bloody show Cervix 6/80/0  FWB reassuring Monitor bleeding

## 2023-01-08 NOTE — Progress Notes (Addendum)
Patient ID: Wendy Adams, female   DOB: Jun 29, 2001, 22 y.o.   MRN: [id number]  S: Feeling Pressure O: AFVSS FHR 130s, + accels Cvx 9/90/0 Toco q1 with coupling  Cervix reducible but then returns  Await complete dilation

## 2023-01-08 NOTE — Anesthesia Procedure Notes (Signed)
Epidural Patient location during procedure: OB Start time: 01/08/2023 3:00 AM End time: 01/08/2023 3:17 AM  Staffing Anesthesiologist: Lowella Curb, MD Performed: anesthesiologist   Preanesthetic Checklist Completed: patient identified, IV checked, site marked, risks and benefits discussed, surgical consent, monitors and equipment checked, pre-op evaluation and timeout performed  Epidural Patient position: sitting Prep: ChloraPrep Patient monitoring: heart rate, cardiac monitor, continuous pulse ox and blood pressure Approach: midline Location: L2-L3 Injection technique: LOR saline  Needle:  Needle type: Tuohy  Needle gauge: 17 G Needle length: 9 cm Needle insertion depth: 6 cm Catheter type: closed end flexible Catheter size: 20 Guage Catheter at skin depth: 10 cm Test dose: negative  Assessment Events: blood not aspirated, injection not painful, no injection resistance, no paresthesia and negative IV test  Additional Notes Reason for block:procedure for pain

## 2023-01-08 NOTE — Anesthesia Preprocedure Evaluation (Signed)
Anesthesia Evaluation  Patient identified by MRN, date of birth, ID band Patient awake    Reviewed: Allergy & Precautions, H&P , NPO status , Patient's Chart, lab work & pertinent test results  Airway Mallampati: I  TM Distance: >3 FB Neck ROM: full    Dental no notable dental hx. (+) Teeth Intact   Pulmonary asthma    Pulmonary exam normal breath sounds clear to auscultation       Cardiovascular hypertension, Normal cardiovascular exam     Neuro/Psych  Headaches  negative psych ROS   GI/Hepatic negative GI ROS, Neg liver ROS,,,  Endo/Other   Hyperthyroidism   Renal/GU negative Renal ROS     Musculoskeletal   Abdominal Normal abdominal exam  (+)   Peds  Hematology negative hematology ROS (+)   Anesthesia Other Findings   Reproductive/Obstetrics (+) Pregnancy                             Anesthesia Physical Anesthesia Plan  ASA: II  Anesthesia Plan: Epidural   Post-op Pain Management:    Induction:   PONV Risk Score and Plan:   Airway Management Planned:   Additional Equipment:   Intra-op Plan:   Post-operative Plan:   Informed Consent: I have reviewed the patients History and Physical, chart, labs and discussed the procedure including the risks, benefits and alternatives for the proposed anesthesia with the patient or authorized representative who has indicated his/her understanding and acceptance.       Plan Discussed with:   Anesthesia Plan Comments:         Anesthesia Quick Evaluation

## 2023-01-08 NOTE — Progress Notes (Signed)
Patient ID: Wendy Adams, female   DOB: 06/24/2001, 22 y.o.   MRN: [id number]  S: Comfortable O" AFVSS FHR declined to 80s after contraction x 3 minutes, returned to baseline then declined again to 80s. FSE applied FHR now 130s, + accels Cvx 9/90/+1  Still able to reduce but then returns. Bleeding heavier than blooding show.. Discussed findings with patient and family.  Currently fetal heart rate is in the 30s with positive Accelerations And moderate variability.  Overall fetal wellbeing is reassuring.  Discussed that bleeding is heavier than would be expected for bloody show but overall fetal and maternal well being are reassuring.  Pit stopped Will continue with trial of labor

## 2023-01-09 LAB — CBC
HCT: 27.5 % — ABNORMAL LOW (ref 36.0–46.0)
Hemoglobin: 8.9 g/dL — ABNORMAL LOW (ref 12.0–15.0)
MCH: 26.6 pg (ref 26.0–34.0)
MCHC: 32.4 g/dL (ref 30.0–36.0)
MCV: 82.1 fL (ref 80.0–100.0)
Platelets: 211 10*3/uL (ref 150–400)
RBC: 3.35 MIL/uL — ABNORMAL LOW (ref 3.87–5.11)
RDW: 14.4 % (ref 11.5–15.5)
WBC: 13.5 10*3/uL — ABNORMAL HIGH (ref 4.0–10.5)
nRBC: 0 % (ref 0.0–0.2)

## 2023-01-09 LAB — KLEIHAUER-BETKE STAIN
# Vials RhIg: 1
Fetal Cells %: 0 %
Quantitation Fetal Hemoglobin: 0 mL

## 2023-01-09 MED ORDER — RHO D IMMUNE GLOBULIN 1500 UNIT/2ML IJ SOSY
300.0000 ug | PREFILLED_SYRINGE | Freq: Once | INTRAMUSCULAR | Status: AC
  Administered 2023-01-09: 300 ug via INTRAVENOUS
  Filled 2023-01-09: qty 2
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
## 2023-01-09 NOTE — Plan of Care (Signed)

## 2023-01-09 NOTE — Anesthesia Postprocedure Evaluation (Signed)
Anesthesia Post Note  Patient: Wendy Adams  Procedure(s) Performed: AN AD HOC LABOR EPIDURAL     Patient location during evaluation: Mother Baby Anesthesia Type: Epidural Level of consciousness: awake, oriented and awake and alert Pain management: pain level controlled Vital Signs Assessment: post-procedure vital signs reviewed and stable Respiratory status: spontaneous breathing, respiratory function stable and nonlabored ventilation Cardiovascular status: stable Postop Assessment: no headache, adequate PO intake, able to ambulate, patient able to bend at knees and no apparent nausea or vomiting Anesthetic complications: no   No notable events documented.  Last Vitals:  Vitals:   01/09/23 0330 01/09/23 0733  BP: 128/83 136/88  Pulse: 78 72  Resp: 18 18  Temp: 36.8 C   SpO2: 100% 100%    Last Pain:  Vitals:   01/09/23 0755  TempSrc:   PainSc: 0-No pain   Pain Goal:                Epidural/Spinal Function Cutaneous sensation: Normal sensation (01/09/23 0755), Patient able to flex knees: Yes (01/09/23 0755), Patient able to lift hips off bed: Yes (01/09/23 0755), Back pain beyond tenderness at insertion site: No (01/09/23 0755), Progressively worsening motor and/or sensory loss: No (01/09/23 0755), Bowel and/or bladder incontinence post epidural: No (01/09/23 0755)  Tramell Piechota

## 2023-01-09 NOTE — Progress Notes (Signed)
PPD #1 No problems Afeb, VSS, BP 128-156/63-108 Fundus firm, NT at U-1 Hgb 8.9 from 9.9 Continue routine postpartum care, continue Procardia XL 30 mg and monitor BP

## 2023-01-10 LAB — RH IG WORKUP (INCLUDES ABO/RH)
Gestational Age(Wks): 38
Unit division: 0

## 2023-01-10 MED ORDER — IBUPROFEN 600 MG PO TABS: 600.0000 mg | ORAL_TABLET | Freq: Four times a day (QID) | ORAL | 0 refills | Status: AC

## 2023-01-10 MED ORDER — NIFEDIPINE ER 30 MG PO TB24: 30.0000 mg | ORAL_TABLET | Freq: Every day | ORAL | 2 refills | Status: AC

## 2023-01-10 NOTE — Progress Notes (Signed)
Patient is doing well.  She is ambulating, voiding, tolerating PO.  Pain control is good.  Lochia is appropriate.  BPs much improved since labor. Strongly desires discharge to home today  Vitals:   01/09/23 1025 01/09/23 1413 01/09/23 2118 01/10/23 0617  BP: (!) 134/93 (!) 137/92 (!) 131/91 135/84  Pulse: 75 72 83 92  Resp: Temp:  98.2 F (36.8 C) 98.3 F (36.8 C) 98.7 F (37.1 C)  TempSrc:  Oral Oral Oral  SpO2:   99% 99%  Weight:      Height:        NAD Fundus firm Ext: no edema  Lab Results  Component Value Date   WBC 13.5 (H) 01/09/2023   HGB 8.9 (L) 01/09/2023   HCT 27.5 (L) 01/09/2023   MCV 82.1 01/09/2023   PLT 211 01/09/2023    --/--/O POS (04/22 2043)  A/P 21 y.o. [id number] PPD#2 s/p TSVD with GHTN. Routine care.   GHTN: much better control on nifedipine Xl  daily.  Will plan short interval BP check in office next week   [facility] GEFFEL Wendy Adams

## 2023-01-10 NOTE — Discharge Summary (Signed)
Postpartum Discharge Summary  Date of Service updated 01/10/23     Patient Name: Wendy Adams DOB: [birth year] MRN: [id number]  Date of admission: 01/07/2023 Delivery date:01/08/2023  Delivering provider: Waynard Reeds  Date of discharge: 01/10/2023  Admitting diagnosis: Gestational hypertension [O13.9] Spontaneous vaginal delivery [O80] Intrauterine pregnancy: [redacted]w[redacted]d     Secondary diagnosis:  Principal Problem:   Gestational hypertension Active Problems:   Spontaneous vaginal delivery  Additional problems: Gestational hypertension    Discharge diagnosis: Term Pregnancy Delivered and Gestational Hypertension                                              Post partum procedures: n/a Augmentation: AROM, Pitocin, and Cytotec Complications: None  Hospital course: Induction of Labor With Vaginal Delivery   22 y.o. yo [id number] at [redacted]w[redacted]d was admitted to the hospital 01/07/2023 for induction of labor.  Indication for induction: Gestational hypertension.  Patient had an labor course complicated by n/a Membrane Rupture Time/Date: 10:00 AM ,01/08/2023   Delivery Method:Vaginal, Spontaneous  Episiotomy: None  Lacerations:  None  Details of delivery can be found in separate delivery note.  Patient had a postpartum course complicated by n/a. She was started on procardia XL  daily following delivery with significant improvement in BP control.  She strongly desired discharge to home on postpartum day #2.  Diastolics still mildly elevated in low 90s, but significant improvement from labor.  Did not desire to stay an extra day, so planned short interval f/u in office for BP check next week. Patient is discharged home 01/10/23.  Newborn Data: Birth date:01/08/2023  Birth time:4:17 PM  Gender:Female  Living status:Living  Apgars:7 ,9  Weight:3220 g   Magnesium Sulfate received: No   Physical exam  Vitals:   01/09/23 1413 01/09/23 2118 01/10/23 0617 01/10/23 1023  BP: (!) 137/92 (!) 131/91  135/84 (!) 130/91  Pulse: 72 83 92 85  Resp: Temp: 98.2 F (36.8 C) 98.3 F (36.8 C) 98.7 F (37.1 C)   TempSrc: Oral Oral Oral   SpO2:  99% 99%   Weight:      Height:       General: alert, cooperative, and no distress Lochia: appropriate Uterine Fundus: firm Incision: N/A DVT Evaluation: No evidence of DVT seen on physical exam. Labs: Lab Results  Component Value Date   WBC 13.5 (H) 01/09/2023   HGB 8.9 (L) 01/09/2023   HCT 27.5 (L) 01/09/2023   MCV 82.1 01/09/2023   PLT 211 01/09/2023      Latest Ref Rng & Units 01/07/2023    8:43 PM  CMP  Glucose 70 - 99 mg/dL 87   BUN 6 - 20 mg/dL 10   Creatinine 1.47 - 1.00 mg/dL 8.29   Sodium 562 - 130 mmol/L 134   Potassium 3.5 - 5.1 mmol/L 3.6   Chloride 98 - 111 mmol/L 105   CO2 22 - 32 mmol/L 20   Calcium 8.9 - 10.3 mg/dL 8.3   Total Protein 6.5 - 8.1 g/dL 6.4   Total Bilirubin 0.3 - 1.2 mg/dL 0.6   Alkaline Phos 38 - 126 U/L 194   AST 15 - 41 U/L 20   ALT 0 - 44 U/L 11    Edinburgh Score:    01/09/2023   10:25 AM  Edinburgh Postnatal Depression Scale Screening Tool  I have been able to laugh and see the funny side of things. 0  I have looked forward with enjoyment to things. 0  I have blamed myself unnecessarily when things went wrong. 0  I have been anxious or worried for no good reason. 0  I have felt scared or panicky for no good reason. 0  Things have been getting on top of me. 0  I have been so unhappy that I have had difficulty sleeping. 0  I have felt sad or miserable. 0  I have been so unhappy that I have been crying. 0  The thought of harming myself has occurred to me. 0  Edinburgh Postnatal Depression Scale Total 0      After visit meds:  Allergies as of 01/10/2023       Reactions   Food Anaphylaxis, Hives, Other (See Comments)   Pt is allergic to peanut butter and [place].    Peanut Oil Anaphylaxis   Beef-derived Products Hives   Chicken Protein Hives   Egg-derived Products Hives    Pork-derived Products Hives   Shellfish Allergy Hives        Medication List     STOP taking these medications    metroNIDAZOLE 1 % gel Commonly known as: Metrogel       TAKE these medications    albuterol 108 (90 Base) MCG/ACT inhaler Commonly known as: VENTOLIN HFA Inhale 2 puffs every 4-6 hours as needed for cough, wheeze, tightness in chest, or shortness of breath.   Cetirizine HCl 10 MG Caps Take by mouth as needed.   EPINEPHrine 0.3 mg/0.3 mL Soaj injection Commonly known as: EPI-PEN Inject 0.3 mg into the muscle as needed for anaphylaxis.   fluticasone 50 MCG/ACT nasal spray Commonly known as: FLONASE Place into the nose as needed.   ibuprofen 600 MG tablet Commonly known as: ADVIL Take 1 tablet (600 mg total) by mouth every 6 (six) hours.   montelukast 10 MG tablet Commonly known as: SINGULAIR Take by mouth as needed.   NIFEdipine 30 MG 24 hr tablet Commonly known as: ADALAT CC Take 1 tablet (30 mg total) by mouth daily.   Prenatal Adult Gummy/DHA/FA 0.4-25 MG Chew Chew by mouth daily.         Discharge home in stable condition Infant Feeding: Bottle Infant Disposition:home with mother Discharge instruction: per After Visit Summary and Postpartum booklet. Activity: Advance as tolerated. Pelvic rest for 6 weeks.  Diet: routine diet Anticipated Birth Control: Unsure Postpartum Appointment:6 weeks Additional Postpartum F/U: BP check 5-7 days Future Appointments:No future appointments. Follow up Visit:  Follow-up Information     Ob/Gyn, [facility] Follow up.   Why: BP check in 5-7 days Contact information: [address] [place] [address] [phone]         Waynard Reeds, MD Follow up in 4 week(s).   Specialty: Obstetrics and Gynecology Why: routine postpartum visit Contact information: [address] [place] [address] [phone]                     01/10/2023 [facility] GEFFEL  Chestine Spore, MD

## 2023-01-17 ENCOUNTER — Telehealth (HOSPITAL_COMMUNITY): Payer: Self-pay

## 2023-01-17 NOTE — Telephone Encounter (Signed)
Patient did not answer phone call. Voicemail left for patient.   Suann Larry [facility] Per[facility]atal Services   01/17/23,1820

## 2023-02-05 DIAGNOSIS — Z124 Encounter for screening for malignant neoplasm of cervix: Secondary | ICD-10-CM | POA: Diagnosis not present

## 2023-02-05 DIAGNOSIS — Z113 Encounter for screening for infections with a predominantly sexual mode of transmission: Secondary | ICD-10-CM | POA: Diagnosis not present

## 2023-02-05 DIAGNOSIS — Z1331 Encounter for screening for depression: Secondary | ICD-10-CM | POA: Diagnosis not present

## 2023-02-07 DIAGNOSIS — Z3202 Encounter for pregnancy test, result negative: Secondary | ICD-10-CM | POA: Diagnosis not present

## 2023-02-07 DIAGNOSIS — Z3042 Encounter for surveillance of injectable contraceptive: Secondary | ICD-10-CM | POA: Diagnosis not present

## 2023-03-19 ENCOUNTER — Ambulatory Visit
Admission: EM | Admit: 2023-03-19 | Discharge: 2023-03-19 | Discharge status: Against Medical Advice | Payer: Medicaid Other | Attending: Family Medicine | Admitting: Family Medicine

## 2023-03-19 DIAGNOSIS — Z711 Person with feared health complaint in whom no diagnosis is made: Secondary | ICD-10-CM

## 2023-03-19 NOTE — ED Notes (Signed)
Did not answer x 3 

## 2023-03-19 NOTE — ED Notes (Signed)
Pt did not answer x 1 

## 2023-05-02 DIAGNOSIS — Z3042 Encounter for surveillance of injectable contraceptive: Secondary | ICD-10-CM | POA: Diagnosis not present

## 2023-05-06 DIAGNOSIS — L0291 Cutaneous abscess, unspecified: Secondary | ICD-10-CM | POA: Diagnosis not present

## 2023-07-25 DIAGNOSIS — Z3042 Encounter for surveillance of injectable contraceptive: Secondary | ICD-10-CM | POA: Diagnosis not present

## 2023-07-27 ENCOUNTER — Ambulatory Visit: Payer: Self-pay

## 2023-10-16 DIAGNOSIS — R0981 Nasal congestion: Secondary | ICD-10-CM | POA: Diagnosis not present

## 2023-10-16 DIAGNOSIS — R519 Headache, unspecified: Secondary | ICD-10-CM | POA: Diagnosis not present

## 2023-10-16 DIAGNOSIS — R52 Pain, unspecified: Secondary | ICD-10-CM | POA: Diagnosis not present

## 2023-10-16 DIAGNOSIS — J111 Influenza due to unidentified influenza virus with other respiratory manifestations: Secondary | ICD-10-CM | POA: Diagnosis not present

## 2023-10-17 DIAGNOSIS — Z3042 Encounter for surveillance of injectable contraceptive: Secondary | ICD-10-CM | POA: Diagnosis not present

## 2024-01-07 DIAGNOSIS — Z3042 Encounter for surveillance of injectable contraceptive: Secondary | ICD-10-CM | POA: Diagnosis not present

## 2024-01-28 DIAGNOSIS — Z113 Encounter for screening for infections with a predominantly sexual mode of transmission: Secondary | ICD-10-CM | POA: Diagnosis not present

## 2024-02-14 DIAGNOSIS — Z0000 Encounter for general adult medical examination without abnormal findings: Secondary | ICD-10-CM | POA: Diagnosis not present

## 2024-02-14 DIAGNOSIS — Z124 Encounter for screening for malignant neoplasm of cervix: Secondary | ICD-10-CM | POA: Diagnosis not present

## 2024-02-14 DIAGNOSIS — R87612 Low grade squamous intraepithelial lesion on cytologic smear of cervix (LGSIL): Secondary | ICD-10-CM | POA: Diagnosis not present

## 2024-02-14 DIAGNOSIS — Z113 Encounter for screening for infections with a predominantly sexual mode of transmission: Secondary | ICD-10-CM | POA: Diagnosis not present

## 2024-03-31 DIAGNOSIS — Z3042 Encounter for surveillance of injectable contraceptive: Secondary | ICD-10-CM | POA: Diagnosis not present

## 2024-08-29 DIAGNOSIS — S161XXA Strain of muscle, fascia and tendon at neck level, initial encounter: Secondary | ICD-10-CM | POA: Diagnosis not present

## 2024-08-31 ENCOUNTER — Encounter (HOSPITAL_COMMUNITY): Payer: Self-pay | Admitting: *Deleted

## 2024-08-31 ENCOUNTER — Ambulatory Visit (INDEPENDENT_AMBULATORY_CARE_PROVIDER_SITE_OTHER)

## 2024-08-31 ENCOUNTER — Ambulatory Visit (HOSPITAL_COMMUNITY)
Admission: EM | Admit: 2024-08-31 | Discharge: 2024-08-31 | Disposition: A | Discharge status: Home / Self Care | Source: Home / Self Care

## 2024-08-31 DIAGNOSIS — M79622 Pain in left upper arm: Secondary | ICD-10-CM | POA: Diagnosis not present

## 2024-08-31 DIAGNOSIS — S40022A Contusion of left upper arm, initial encounter: Secondary | ICD-10-CM | POA: Diagnosis not present

## 2024-08-31 MED ORDER — MELOXICAM 7.5 MG PO TABS: 7.5000 mg | ORAL_TABLET | Freq: Two times a day (BID) | ORAL | 0 refills | Status: AC | PRN

## 2024-08-31 MED ORDER — LIDOCAINE 4 % EX PTCH: 1.0000 | MEDICATED_PATCH | Freq: Two times a day (BID) | CUTANEOUS | 0 refills | Status: AC | PRN
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
## 2024-08-31 NOTE — Discharge Instructions (Signed)
-   Your left arm x-ray looks normal to me.  I will call in 1 to 2 hours if the radiologist sees something that I did not.  If you do not get a call, the x-ray was normal.   - For pain, you can take the meloxicam  pain medication up to twice daily as needed.  Make sure to take this with food, and do not take other NSAIDs while taking this medication.  This includes ibuprofen , naproxen, Aleve.  You can still take Tylenol , up to 1000 mg 3 times daily. - Apply 1 lidocaine  patch to the affected area up to every 12 hours.  Do not use a heating pad on top of the lidocaine  patch, because this can make it release its medication too quickly. - I expect your pain to improve over the next week, but if it worsens instead of improves, I recommend going to [facility] walk-in clinic.
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
